# Patient Record
Sex: Male | Born: 1965 | Race: White | Hispanic: No | Marital: Single | Smoking: Former smoker
Health system: Southern US, Community
[De-identification: ages and names within clinical notes are randomized; demographics above are authoritative.]

## PROBLEM LIST (undated history)

## (undated) DIAGNOSIS — Z720 Tobacco use: Secondary | ICD-10-CM

## (undated) DIAGNOSIS — F1010 Alcohol abuse, uncomplicated: Secondary | ICD-10-CM

## (undated) DIAGNOSIS — I10 Essential (primary) hypertension: Secondary | ICD-10-CM

## (undated) DIAGNOSIS — E785 Hyperlipidemia, unspecified: Secondary | ICD-10-CM

## (undated) DIAGNOSIS — I471 Supraventricular tachycardia, unspecified: Secondary | ICD-10-CM

## (undated) DIAGNOSIS — F419 Anxiety disorder, unspecified: Secondary | ICD-10-CM

## (undated) HISTORY — PX: HAND SURGERY: SHX662

## (undated) HISTORY — DX: Tobacco use: Z72.0

## (undated) HISTORY — DX: Alcohol abuse, uncomplicated: F10.10

## (undated) HISTORY — DX: Supraventricular tachycardia: I47.1

## (undated) HISTORY — PX: TOE SURGERY: SHX1073

## (undated) HISTORY — DX: Supraventricular tachycardia, unspecified: I47.10

---

## 2011-07-07 ENCOUNTER — Inpatient Hospital Stay (INDEPENDENT_AMBULATORY_CARE_PROVIDER_SITE_OTHER)
Admission: RE | Admit: 2011-07-07 | Discharge: 2011-07-07 | Disposition: A | Discharge status: Home / Self Care | Payer: Managed Care, Other (non HMO) | Source: Ambulatory Visit | Attending: Emergency Medicine | Admitting: Emergency Medicine

## 2011-07-07 ENCOUNTER — Encounter: Payer: Self-pay | Admitting: Emergency Medicine

## 2011-07-07 DIAGNOSIS — I10 Essential (primary) hypertension: Secondary | ICD-10-CM | POA: Insufficient documentation

## 2011-07-07 DIAGNOSIS — IMO0002 Reserved for concepts with insufficient information to code with codable children: Secondary | ICD-10-CM

## 2011-07-10 ENCOUNTER — Telehealth (INDEPENDENT_AMBULATORY_CARE_PROVIDER_SITE_OTHER): Payer: Self-pay | Admitting: Emergency Medicine

## 2011-08-16 NOTE — Telephone Encounter (Signed)
  Phone Note Outgoing Call Call back at [facility] Phone [phone]   Call placed by: Lavell Islam RN,  July 10, 2011 3:15 PM Call placed to: Patient Summary of Call: Taking Rx for ABX; given wound culture results of +Staph; states area still oozing/red/edematous, but not spreading. Has appt. with PCP 10-29; told him we would be happy to evaluated here over weekend if he desires. Initial call taken by: Lavell Islam RN,  July 10, 2011 3:18 PM

## 2011-08-16 NOTE — Progress Notes (Signed)
Summary: BITE ON LT ARM...WSE   Vital Signs:  Patient Profile:   45 Years Old Male CC:      bite on LT arm x 3 days Height:     68 inches Weight:      202 pounds O2 Sat:      99 % O2 treatment:    Room Air Temp:     98.0 degrees F oral Pulse rate:   75 / minute Resp:     18 per minute BP sitting:   147 / 88  (left arm) Cuff size:   large  Vitals Entered By: Clemens Catholic LPN (July 07, 2011 6:14 PM)                  Updated Prior Medication List: BYSTOLIC 10 MG TABS (NEBIVOLOL HCL)  EXFORGE 10-160 MG TABS (AMLODIPINE BESYLATE-VALSARTAN)   Current Allergies: ! PRINIVILHistory of Present Illness Chief Complaint: bite on LT arm x 3 days History of Present Illness: ? spider bite on L arm around elbow.  He was working out in the yard and there were a lot of spiders around.  Doesn't recall any poison ivy and he has never gotten before.  No pain, no itching, no irritation.  It has been draining clear fluid.  Not using any meds.  REVIEW OF SYSTEMS Constitutional Symptoms      Denies fever, chills, night sweats, weight loss, weight gain, and fatigue.  Eyes       Denies change in vision, eye pain, eye discharge, glasses, contact lenses, and eye surgery. Ear/Nose/Throat/Mouth       Denies hearing loss/aids, change in hearing, ear pain, ear discharge, dizziness, frequent runny nose, frequent nose bleeds, sinus problems, sore throat, hoarseness, and tooth pain or bleeding.  Respiratory       Denies dry cough, productive cough, wheezing, shortness of breath, asthma, bronchitis, and emphysema/COPD.  Cardiovascular       Denies murmurs, chest pain, and tires easily with exhertion.    Gastrointestinal       Denies stomach pain, nausea/vomiting, diarrhea, constipation, blood in bowel movements, and indigestion. Genitourniary       Denies painful urination, kidney stones, and loss of urinary control. Neurological       Denies paralysis, seizures, and  fainting/blackouts. Musculoskeletal       Denies muscle pain, joint pain, joint stiffness, decreased range of motion, redness, swelling, muscle weakness, and gout.  Skin       Denies bruising, unusual mles/lumps or sores, and hair/skin or nail changes.  Psych       Denies mood changes, temper/anger issues, anxiety/stress, speech problems, depression, and sleep problems. Other Comments: pt c/o bite on LT arm x 3 days.no fever. no OTC meds.   Past History:  Past Medical History: Hypertension  Past Surgical History: Rt hand surgery x 4  Family History: Family History Hypertension gmother- AA  Social History: Never Smoked Alcohol use-yes 6 per day Drug use-no Smoking Status:  never Drug Use:  no Physical Exam General appearance: well developed, well nourished, no acute distress MSE: oriented to time, place, and person L arm: vesicular and macular rash anterior arm around elbow.  Clear fluid.  Mild erythema. Assessment New Problems: HYPERTENSION (ICD-401.9) CELLULITIS AND ABSCESS OF UPPER ARM AND FOREARM (ICD-682.3)  Contact derm (poison ivy) vs Mild cellulitis  Plan New Medications/Changes: TRIAMCINOLONE ACETONIDE 0.1 % CREA (TRIAMCINOLONE ACETONIDE) apply two times a day to affected area  (4x4cm)  #QS x 0, 07/07/2011, Hoyt Koch  MD DOXYCYCLINE HYCLATE 100 MG CAPS (DOXYCYCLINE HYCLATE) 1 by mouth two times a day for 1 week  #14 x 0, 07/07/2011, Hoyt Koch MD  New Orders: New Patient Level II [99202] T-Culture, Wound [[id number]] Planning Comments:   Warm compress, don't squeeze.  Take antibiotics as directed.  Also with steroid cream since I think poison ivy is more probable than cellulitis.  Follow-up with your primary care physician if not improving or if getting worse   The patient and/or caregiver has been counseled thoroughly with regard to medications prescribed including dosage, schedule, interactions, rationale for use, and possible side  effects and they verbalize understanding.  Diagnoses and expected course of recovery discussed and will return if not improved as expected or if the condition worsens. Patient and/or caregiver verbalized understanding.  Prescriptions: TRIAMCINOLONE ACETONIDE 0.1 % CREA (TRIAMCINOLONE ACETONIDE) apply two times a day to affected area  (4x4cm)  #QS x 0   Entered and Authorized by:   Hoyt Koch MD   Signed by:   Hoyt Koch MD on 07/07/2011   Method used:   Print then Give to Patient   RxID:   [id number] DOXYCYCLINE HYCLATE 100 MG CAPS (DOXYCYCLINE HYCLATE) 1 by mouth two times a day for 1 week  #14 x 0   Entered and Authorized by:   Hoyt Koch MD   Signed by:   Hoyt Koch MD on 07/07/2011   Method used:   Print then Give to Patient   RxID:   [id number]   Orders Added: 1)  New Patient Level II [99202] 2)  T-Culture, Wound [[id number]]

## 2013-11-07 ENCOUNTER — Encounter: Payer: Self-pay | Admitting: Emergency Medicine

## 2013-11-07 ENCOUNTER — Emergency Department (INDEPENDENT_AMBULATORY_CARE_PROVIDER_SITE_OTHER)
Admission: EM | Admit: 2013-11-07 | Discharge: 2013-11-07 | Disposition: A | Discharge status: Home / Self Care | Payer: PRIVATE HEALTH INSURANCE | Source: Home / Self Care | Attending: Family Medicine | Admitting: Family Medicine

## 2013-11-07 DIAGNOSIS — R197 Diarrhea, unspecified: Secondary | ICD-10-CM

## 2013-11-07 DIAGNOSIS — R110 Nausea: Secondary | ICD-10-CM

## 2013-11-07 HISTORY — DX: Anxiety disorder, unspecified: F41.9

## 2013-11-07 HISTORY — DX: Essential (primary) hypertension: I10

## 2013-11-07 HISTORY — DX: Hyperlipidemia, unspecified: E78.5

## 2013-11-07 MED ORDER — ONDANSETRON 4 MG PO TBDP
4.0000 mg | ORAL_TABLET | Freq: Once | ORAL | Status: AC
  Administered 2013-11-07: 4 mg via ORAL

## 2013-11-07 MED ORDER — ONDANSETRON 4 MG PO TBDP: ORAL_TABLET | ORAL | Status: DC

## 2013-11-07 NOTE — ED Provider Notes (Signed)
CSN: [id number]     Arrival date & time 11/07/13  [id number] History   First MD Initiated Contact with Patient 11/07/13 [id number]     Chief Complaint  Patient presents with  . Diarrhea       HPI Comments: Yesterday afternoon patient developed abdominal bloating after lunch, followed by persistent watery diarrhea.  He has had persistent nausea without vomiting.  He has had fatigue and chills/sweats.  Denies recent foreign travel, or drinking untreated water in a wilderness environment.   Patient is a 48 y.o. male presenting with diarrhea. The history is provided by the patient.  Diarrhea Quality:  Watery Severity:  Moderate Onset quality:  Sudden Duration:  1 day Timing:  Intermittent Progression:  Unchanged Relieved by:  Nothing Worsened by:  Nothing tried Ineffective treatments:  None tried Associated symptoms: abdominal pain, chills and diaphoresis   Associated symptoms: no arthralgias, no recent cough, no fever, no headaches, no myalgias, no URI and no vomiting   Risk factors: no recent antibiotic use, no sick contacts, no suspicious food intake and no travel to endemic areas     Past Medical History  Diagnosis Date  . Hypertension   . Anxiety   . Hyperlipidemia    Past Surgical History  Procedure Laterality Date  . Rt great toe     Family History  Problem Relation Age of Onset  . Cancer Mother   . Cancer Father   . Heart disease Father    History  Substance Use Topics  . Smoking status: Never Smoker   . Smokeless tobacco: Not on file  . Alcohol Use: Yes    Review of Systems  Constitutional: Positive for chills and diaphoresis. Negative for fever.  Gastrointestinal: Positive for abdominal pain and diarrhea. Negative for vomiting.  Musculoskeletal: Negative for arthralgias and myalgias.  Neurological: Negative for headaches.  All other systems reviewed and are negative.      Allergies  Lisinopril and Paxil  Home Medications   Current Outpatient Rx  Name  Route   Sig  Dispense  Refill  . ALPRAZolam (XANAX) 0.5 MG tablet   Oral   Take 0.5 mg by mouth at bedtime as needed for anxiety.         Marland Kitchen. amLODipine-valsartan (EXFORGE) 10-160 MG per tablet   Oral   Take 1 tablet by mouth daily.         Marland Kitchen. aspirin 81 MG tablet   Oral   Take 81 mg by mouth daily.         . Fenofibric Acid 105 MG TABS   Oral   Take by mouth.         . nebivolol (BYSTOLIC) 10 MG tablet   Oral   Take 10 mg by mouth daily.         Marland Kitchen. omeprazole (PRILOSEC) 10 MG capsule   Oral   Take 10 mg by mouth daily.         . ondansetron (ZOFRAN ODT) 4 MG disintegrating tablet      Take one tab by mouth Q6hr prn nausea   12 tablet   0    BP 131/83  Pulse 82  Temp(Src) 97.6 F (36.4 C) (Oral)  Ht 5\' 8"  (1.727 m)  Wt 209 lb (94.802 kg)  BMI 31.79 kg/m2  SpO2 97% Physical Exam Nursing notes and Vital Signs reviewed. Appearance:  Patient appears healthy, stated age, and in no acute distress Eyes:  Pupils are equal, round, and reactive to light and  accomodation.  Extraocular movement is intact.  Conjunctivae are not inflamed  Pharynx:  Normal; moist mucous membranes  Neck:  Supple.  No adenopathy Lungs:  Clear to auscultation.  Breath sounds are equal.  Heart:  Regular rate and rhythm without murmurs, rubs, or gallops.  Abdomen:  Vague peri-umbilical tenderness without masses or hepatosplenomegaly.  Bowel sounds are present and increased. No CVA or flank tenderness.  Extremities:  No edema.  No calf tenderness Skin:  No rash present.   ED Course  Procedures  none       MDM   Final diagnoses:  Nausea alone; suspect viral gastroenteritis  Diarrhea   Zofran ODT 4mg  administered.  Rx for Zofran 4mg  ODT  Begin clear liquids (Pedialyte while having diarrhea) until improved, then advance to a [employer] (Bananas, Rice, Applesauce, Toast).  Then gradually resume a regular diet when tolerated.  Avoid milk products until well.  To decrease diarrhea, mix one  heaping tablespoon Citrucel (methylcellulose) in 8 oz water and drink one to three times daily.  When stools become more formed, may take Imodium (loperamide) once or twice daily to decrease stool frequency.  If symptoms become significantly worse during the night or over the weekend, proceed to the local emergency room. Followup with Family Doctor if not improved in about 5 day    Lattie Haw, MD 11/07/13 [phone]

## 2013-11-07 NOTE — ED Notes (Signed)
Diarrhea since yesterday, abdominal pain, nausea

## 2013-11-07 NOTE — Discharge Instructions (Signed)
Begin clear liquids (Pedialyte while having diarrhea) until improved, then advance to a BRAT diet (Bananas, Rice, Applesauce, Toast).  Then gradually resume a regular diet when tolerated.  Avoid milk products until well.  To decrease diarrhea, mix one heaping tablespoon Citrucel (methylcellulose) in 8 oz water and drink one to three times daily.  When stools become more formed, may take Imodium (loperamide) once or twice daily to decrease stool frequency.  °If symptoms become significantly worse during the night or over the weekend, proceed to the local emergency room.  °

## 2013-11-11 ENCOUNTER — Telehealth: Payer: Self-pay

## 2013-11-11 NOTE — ED Notes (Signed)
I called and spoke with patient and he is doing better. I advised to call back if anything changes or if he has questions or concerns.  

## 2014-02-07 ENCOUNTER — Ambulatory Visit (INDEPENDENT_AMBULATORY_CARE_PROVIDER_SITE_OTHER): Payer: PRIVATE HEALTH INSURANCE | Admitting: Cardiovascular Disease

## 2014-02-07 ENCOUNTER — Encounter: Payer: Self-pay | Admitting: Cardiovascular Disease

## 2014-02-07 VITALS — BP 150/80 | HR 71 | Ht 68.0 in | Wt 213.1 lb

## 2014-02-07 DIAGNOSIS — I471 Supraventricular tachycardia, unspecified: Secondary | ICD-10-CM

## 2014-02-07 DIAGNOSIS — E785 Hyperlipidemia, unspecified: Secondary | ICD-10-CM

## 2014-02-07 DIAGNOSIS — F1010 Alcohol abuse, uncomplicated: Secondary | ICD-10-CM

## 2014-02-07 DIAGNOSIS — F17200 Nicotine dependence, unspecified, uncomplicated: Secondary | ICD-10-CM

## 2014-02-07 DIAGNOSIS — I10 Essential (primary) hypertension: Secondary | ICD-10-CM

## 2014-02-07 DIAGNOSIS — Z720 Tobacco use: Secondary | ICD-10-CM

## 2014-02-07 DIAGNOSIS — R002 Palpitations: Secondary | ICD-10-CM

## 2014-02-07 NOTE — Patient Instructions (Signed)
Dr Berry has recommended that you follow-up with him as needed. 

## 2014-02-07 NOTE — Assessment & Plan Note (Signed)
Patient was referred by Dr. Francoise Schaumann to be established in our practice for ongoing care of PSVT. He is a 48 year old mildly overweight Caucasian male with multiple cardiac risk factors. He has first episode of PSVT back in 2002. He has had stress tests, echocardiogram and a cardiac cath back in 2006 that was normal. He attributes this to stress. He is aware of the symptoms. He does get occasional palpitations on a daily basis. His last episode of PSVT was March 2012. He does drink 4-6 beers a day during weekdays and at the upper 12 pack a day on the weekend. He is resistant to risk factor modification. He also has a 20-pack-year history of tobacco abuse having quit 14 years ago

## 2014-02-07 NOTE — Assessment & Plan Note (Signed)
On fenofibrate probably are related to alcohol intake

## 2014-02-07 NOTE — Progress Notes (Signed)
02/07/2014 Lonnie Castillo   07/20/1966  [id number]  Primary Physician Cala Bradford, MD Primary Cardiologist: Runell Gess MD [name]   HPI:  Mr. Lonnie Castillo is a 48 year old mildly mildly overweight divorced Caucasian male with no children who works as an Journalist, newspaper. He was referred by Dr. Laurann Montana right for cardiovascular evaluation and to be established in our practice because of prior cardiac disease. He has a history of tobacco abuse, 15 pack years, having quit 14 years ago. History of hypertension and hyperlipidemia. There is no family history of heart disease. He has never had a heart attack or stroke and denies chest pain or shortness of breath. There is a history of PSVT  Dating back to 2002. He has had recurrent episodes over the years most recently March 2012. He had echocardiograms, stress test and a cardiac catheterization in 2006 which was normal. He does get  daily palpitations.   Current Outpatient Prescriptions  Medication Sig Dispense Refill  . ALPRAZolam (XANAX) 0.5 MG tablet Take 0.5 mg by mouth at bedtime as needed for anxiety.      Marland Kitchen amLODipine-valsartan (EXFORGE) 10-160 MG per tablet Take 1 tablet by mouth daily.      Marland Kitchen aspirin 81 MG tablet Take 81 mg by mouth daily.      . B Complex-C (B-COMPLEX WITH VITAMIN C) tablet Take 1 tablet by mouth daily.      . Choline Fenofibrate (FENOFIBRIC ACID) 135 MG CPDR Take 1 tablet by mouth daily.      . Fenofibric Acid 105 MG TABS Take by mouth.      . Multiple Vitamin (MULTIVITAMIN) tablet Take 1 tablet by mouth daily.      . naproxen sodium (ANAPROX) 550 MG tablet Take 550 mg by mouth as needed.      . nebivolol (BYSTOLIC) 10 MG tablet Take 10 mg by mouth daily.      . Omega-3 Fatty Acids (FISH OIL PO) Take 2,400 mg by mouth daily.      Marland Kitchen omeprazole (PRILOSEC) 10 MG capsule Take 10 mg by mouth daily.      . ondansetron (ZOFRAN ODT) 4 MG disintegrating tablet Take one tab by mouth Q6hr prn nausea  12  tablet  0  . Thiamine HCl (VITAMIN B-1) 250 MG tablet Take 250 mg by mouth daily.      . vitamin B-12 (CYANOCOBALAMIN) 1000 MCG tablet Take 1,000 mcg by mouth daily.       No current facility-administered medications for this visit.    Allergies  Allergen Reactions  . Lisinopril   . Paxil [Paroxetine Hcl]     History   Social History  . Marital Status: Single    Spouse Name: N/A    Number of Children: N/A  . Years of Education: N/A   Occupational History  . Not on file.   Social History Main Topics  . Smoking status: Former Smoker -- 1.50 packs/day    Types: Cigarettes    Quit date: 02/08/2000  . Smokeless tobacco: Not on file  . Alcohol Use: Yes  . Drug Use: Not on file  . Sexual Activity: Not on file   Other Topics Concern  . Not on file   Social History Narrative  . No narrative on file     Review of Systems: General: negative for chills, fever, night sweats or weight changes.  Cardiovascular: negative for chest pain, dyspnea on exertion, edema, orthopnea, palpitations, paroxysmal nocturnal dyspnea or shortness of  breath Dermatological: negative for rash Respiratory: negative for cough or wheezing Urologic: negative for hematuria Abdominal: negative for nausea, vomiting, diarrhea, bright red blood per rectum, melena, or hematemesis Neurologic: negative for visual changes, syncope, or dizziness All other systems reviewed and are otherwise negative except as noted above.    Blood pressure 150/80, pulse 71, height 5\' 8"  (1.727 m), weight 213 lb 1.6 oz (96.662 kg).  General appearance: alert and no distress Neck: no adenopathy, no carotid bruit, no JVD, supple, symmetrical, trachea midline and thyroid not enlarged, symmetric, no tenderness/mass/nodules Lungs: clear to auscultation bilaterally Heart: regular rate and rhythm, S1, S2 normal, no murmur, click, rub or gallop Extremities: extremities normal, atraumatic, no cyanosis or edema  EKG normal sinus rhythm  at 71 with no ST or T wave changes  ASSESSMENT AND PLAN:   PSVT (paroxysmal supraventricular tachycardia) Patient was referred by Dr. Francoise Schaumannynthia Wright to be established in our practice for ongoing care of PSVT. He is a 48 year old mildly overweight Caucasian male with multiple cardiac risk factors. He has first episode of PSVT back in 2002. He has had stress tests, echocardiogram and a cardiac cath back in 2006 that was normal. He attributes this to stress. He is aware of the symptoms. He does get occasional palpitations on a daily basis. His last episode of PSVT was March 2012. He does drink 4-6 beers a day during weekdays and at the upper 12 pack a day on the weekend. He is resistant to risk factor modification. He also has a 20-pack-year history of tobacco abuse having quit 14 years ago  HYPERTENSION Controlled on current medications  Hyperlipidemia On fenofibrate probably are related to alcohol intake      Runell GessJonathan J. Berry MD [facility]trFACP,FACC,FAHA, [facility] 02/07/2014 1:55 PM

## 2014-02-07 NOTE — Assessment & Plan Note (Signed)
Controlled on current medications 

## 2014-10-29 ENCOUNTER — Ambulatory Visit (INDEPENDENT_AMBULATORY_CARE_PROVIDER_SITE_OTHER): Payer: PRIVATE HEALTH INSURANCE | Admitting: Neurology

## 2014-10-29 ENCOUNTER — Encounter: Payer: Self-pay | Admitting: Neurology

## 2014-10-29 VITALS — BP 130/78 | HR 76 | Ht 68.0 in | Wt 205.0 lb

## 2014-10-29 DIAGNOSIS — G44021 Chronic cluster headache, intractable: Secondary | ICD-10-CM

## 2014-10-29 DIAGNOSIS — Z7289 Other problems related to lifestyle: Secondary | ICD-10-CM

## 2014-10-29 DIAGNOSIS — Z789 Other specified health status: Secondary | ICD-10-CM

## 2014-10-29 DIAGNOSIS — F1099 Alcohol use, unspecified with unspecified alcohol-induced disorder: Secondary | ICD-10-CM

## 2014-10-29 MED ORDER — ZOLMITRIPTAN 5 MG NA SOLN: 1.0000 | NASAL | Status: DC | PRN

## 2014-10-29 MED ORDER — VERAPAMIL HCL 80 MG PO TABS: 80.0000 mg | ORAL_TABLET | Freq: Three times a day (TID) | ORAL | Status: DC

## 2014-10-29 MED ORDER — ZOLMITRIPTAN 2.5 MG NA SOLN: 1.0000 | Freq: Every day | NASAL | Status: DC

## 2014-10-29 NOTE — Patient Instructions (Addendum)
1. Start taking verapamil 80mg  three times daily as preventative therapy 2. For severe headache, take zomig 2.5mg  nasal spray at headache onset.  I have given you a sample of the 2.5 and 5mg  nasal spray, if this helps, call my office so I can send a prescription 3. Call with update in 1 month 4. Return to clinic in 2 months

## 2014-10-29 NOTE — Addendum Note (Signed)
Addended by: Vivien RotaRIVER, Imre Vecchione H on: 1/61/09602/16/2016 02:30 PM   Modules accepted: Orders

## 2014-10-29 NOTE — Progress Notes (Signed)
Note faxed.

## 2014-10-29 NOTE — Progress Notes (Signed)
[facility] Clinic Note - Initial Visit   Date: 10/29/2014   Lonnie Castillo MRN: [id number] DOB: [birth year]   Dear Dr. Cliffton Asters:  Thank you for your kind referral of Lonnie Castillo for consultation of headaches. Although his history is well known to you, please allow [place] to reiterate it for the purpose of our medical record. The patient was accompanied to the clinic by self.    History of Present Illness: Lonnie Castillo is a 49 y.o. right-handed Caucasian male with hypertension, GERD, prior tobacco use, vitamin B12 deficiency presenting for evaluation of headaches.    He reports having headaches since 2012 which occurs only on the left side with stabbing pain around the eye and cheek.  Headaches occur daily over the past 6 weeks, but he has episodic severe debilitating pain occuring several times a day, which lasts from seconds to 2 min.  He reports having mild droopiness of the right eye lid, redness of the eye, and tearing of the left eye when pain is severe.  He denies nausea/vomiting, photophobia, or phonophobia. Staying busy and working helps distract him from the pain.  He has previously tried Aleve which helped for a while and topamax (does not recall why he stopped it). He also complains of having difficulty recognizing objects.  He admits to have short-term memory problems.  Denies problems with driving or getting lost.    He has been drinking beer since the age of 9 and was drinking heavy while in the [employer].  He has reduced his beer intake from 8 beers nightly to 2-3 per day.   He has MRI brain in 2013 which was normal per report.   Past Medical History  Diagnosis Date  . Hypertension   . Anxiety   . Hyperlipidemia   . PSVT (paroxysmal supraventricular tachycardia)   . Tobacco abuse   . Alcohol abuse     Past Surgical History  Procedure Laterality Date  . Toe surgery Right   . Hand surgery Right      Medications:  Current Outpatient  Prescriptions on File Prior to Visit  Medication Sig Dispense Refill  . ALPRAZolam (XANAX) 0.5 MG tablet Take 0.5 mg by mouth at bedtime as needed for anxiety.    Marland Kitchen amLODipine-valsartan (EXFORGE) 10-160 MG per tablet Take 1 tablet by mouth daily.    Marland Kitchen aspirin 81 MG tablet Take 81 mg by mouth daily.    . B Complex-C (B-COMPLEX WITH VITAMIN C) tablet Take 1 tablet by mouth daily.    . Fenofibric Acid 105 MG TABS Take by mouth.    . Multiple Vitamin (MULTIVITAMIN) tablet Take 1 tablet by mouth daily.    . naproxen sodium (ANAPROX) 550 MG tablet Take 550 mg by mouth as needed.    . nebivolol (BYSTOLIC) 10 MG tablet Take 10 mg by mouth daily.    . Omega-3 Fatty Acids (FISH OIL PO) Take 2,400 mg by mouth daily.    Marland Kitchen omeprazole (PRILOSEC) 10 MG capsule Take 10 mg by mouth daily.    . Thiamine HCl (VITAMIN B-1) 250 MG tablet Take 250 mg by mouth daily.    . vitamin B-12 (CYANOCOBALAMIN) 1000 MCG tablet Take 1,000 mcg by mouth daily.     No current facility-administered medications on file prior to visit.    Allergies:  Allergies  Allergen Reactions  . Lisinopril   . Paxil [Paroxetine Hcl]     Family History: Family History  Problem Relation Age of Onset  .  Hyperlipidemia Mother   . Acromegaly Mother   . Arthritis Mother     Osteoarthritis  . Pulmonary embolism Father   . Healthy Brother     Social History: History   Social History  . Marital Status: Single    Spouse Name: N/A  . Number of Children: N/A  . Years of Education: N/A   Occupational History  . Not on file.   Social History Main Topics  . Smoking status: Former Smoker -- 1.50 packs/day for 15 years    Types: Cigarettes    Quit date: 02/08/2000  . Smokeless tobacco: Not on file  . Alcohol Use: 0.0 oz/week    0 Standard drinks or equivalent per week     Comment: 18 back of beer a week  . Drug Use: No  . Sexual Activity: Not on file   Other Topics Concern  . Not on file   Social History Narrative   He  lives alone.   He works as an Engineer, maintenanceauto technician.   Highest level of education:  did not complete associated degree    Review of Systems:  CONSTITUTIONAL: No fevers, chills, night sweats, or weight loss.   EYES: No visual changes or eye pain ENT: No hearing changes.  No history of nose bleeds.   RESPIRATORY: No cough, wheezing and shortness of breath.   CARDIOVASCULAR: Negative for chest pain, and palpitations.   GI: Negative for abdominal discomfort, blood in stools or black stools.  No recent change in bowel habits.   GU:  No history of incontinence.   MUSCLOSKELETAL: No history of joint pain or swelling.  No myalgias.   SKIN: Negative for lesions, rash, and itching.   HEMATOLOGY/ONCOLOGY: Negative for prolonged bleeding, bruising easily, and swollen nodes.  No history of cancer.   ENDOCRINE: Negative for cold or heat intolerance, polydipsia or goiter.   PSYCH:  No depression or anxiety symptoms.   NEURO: As Above.   Vital Signs:  BP 130/78 mmHg  Pulse 76  Ht 5\' 8"  (1.727 m)  Wt 205 lb (92.987 kg)  BMI 31.18 kg/m2   General Medical Exam:   General:  Well appearing, comfortable.   Eyes/ENT: see cranial nerve examination.   Neck: No masses appreciated.  Full range of motion without tenderness.  No carotid bruits. Respiratory:  Clear to auscultation, good air entry bilaterally.   Cardiac:  Regular rate and rhythm, no murmur.   Extremities:  No deformities, edema, or skin discoloration.  Skin:  No rashes or lesions.  Neurological Exam: MENTAL STATUS including orientation to time, place, person, recent and remote memory, attention span and concentration, language, and fund of knowledge is normal.  Speech is not dysarthric.  CRANIAL NERVES: II:  No visual field defects.  Unremarkable fundi.   III-IV-VI: Pupils equal round and reactive to light.  Normal conjugate, extra-ocular eye movements in all directions of gaze.  No nystagmus.  No ptosis.   V:  Normal facial sensation.       VII:  Normal facial symmetry and movements.  No pathologic facial reflexes.  VIII:  Normal hearing and vestibular function.   IX-X:  Normal palatal movement.   XI:  Normal shoulder shrug and head rotation.   XII:  Normal tongue strength and range of motion, no deviation or fasciculation.  MOTOR:  No atrophy, fasciculations or abnormal movements.  No pronator drift.  Tone is normal.    Right Upper Extremity:    Left Upper Extremity:    Deltoid  5/5   Deltoid  5/5   Biceps  5/5   Biceps  5/5   Triceps  5/5   Triceps  5/5   Wrist extensors  5/5   Wrist extensors  5/5   Wrist flexors  5/5   Wrist flexors  5/5   Finger extensors  5/5   Finger extensors  5/5   Finger flexors  5/5   Finger flexors  5/5   Dorsal interossei  5/5   Dorsal interossei  5/5   Abductor pollicis  5/5   Abductor pollicis  5/5   Tone (Ashworth scale)  0  Tone (Ashworth scale)  0   Right Lower Extremity:    Left Lower Extremity:    Hip flexors  5/5   Hip flexors  5/5   Hip extensors  5/5   Hip extensors  5/5   Knee flexors  5/5   Knee flexors  5/5   Knee extensors  5/5   Knee extensors  5/5   Dorsiflexors  5/5   Dorsiflexors  5/5   Plantarflexors  5/5   Plantarflexors  5/5   Toe extensors  5/5   Toe extensors  5/5   Toe flexors  5/5   Toe flexors  5/5   Tone (Ashworth scale)  0  Tone (Ashworth scale)  0   MSRs:  Right                                                                 Left brachioradialis 2+  brachioradialis 2+  biceps 2+  biceps 2+  triceps 2+  triceps 2+  patellar 2+  patellar 2+  ankle jerk 2+  ankle jerk 1+  Hoffman no  Hoffman no  plantar response down  plantar response down   SENSORY:  Vibration diminished at the left great toe, otherwise normal and symmetric perception of light touch, pinprick, and proprioception.  Romberg's sign absent.   COORDINATION/GAIT: Normal finger-to- nose-finger and heel-to-shin.  Intact rapid alternating movements bilaterally.  Able to rise from a chair  without using arms.  Gait narrow based and stable. Tandem and stressed gait intact.    IMPRESSION: Mr. Potts is a 49 year-old gentleman with alcohol dependence presenting for evaluation of chronic left periorbital headaches.  He has associated autonomic dysfunction associated with it.  Based on the frequency and duration of his headaches, he most likely has cluster headaches.  I will start him on verapamil /d for preventative therapy and offer zomig nasal spray for abortive therapy.  I further encouraged him on alcohol cessation from generalized health standpoint, but further to prevent long-term neurological complications including cognitive impairment and neuropathy.   PLAN/RECOMMENDATIONS:  1.  Start verapamil  TID for preventative therapy.  Instructed to monitor blood pressure while taking this since he is already on several BP medications 2.  Samples provided for zomig 2.5mg /5mg  for abortive therapy.  Consider oxygen therapy going forward 3.  Patient to call with update in 1 month 4.  Return to clinic in 2 months   The duration of this appointment visit was 50 minutes of face-to-face time with the patient.  Greater than 50% of this time was spent in counseling, explanation of diagnosis, planning of further management, and coordination of care.  Thank you for allowing me to participate in patient's care.  If I can answer any additional questions, I would be pleased to do so.    Sincerely,    Husam Hohn K. Posey Pronto, DO

## 2015-01-14 ENCOUNTER — Encounter: Payer: Self-pay | Admitting: Neurology

## 2015-01-14 ENCOUNTER — Ambulatory Visit (INDEPENDENT_AMBULATORY_CARE_PROVIDER_SITE_OTHER): Payer: PRIVATE HEALTH INSURANCE | Admitting: Neurology

## 2015-01-14 VITALS — BP 130/80 | HR 72 | Ht 68.0 in | Wt 200.0 lb

## 2015-01-14 DIAGNOSIS — G44029 Chronic cluster headache, not intractable: Secondary | ICD-10-CM | POA: Insufficient documentation

## 2015-01-14 DIAGNOSIS — Z79899 Other long term (current) drug therapy: Secondary | ICD-10-CM

## 2015-01-14 MED ORDER — NORTRIPTYLINE HCL 10 MG PO CAPS: 10.0000 mg | ORAL_CAPSULE | Freq: Every day | ORAL | Status: DC

## 2015-01-14 MED ORDER — VERAPAMIL HCL 80 MG PO TABS: 80.0000 mg | ORAL_TABLET | Freq: Three times a day (TID) | ORAL | Status: DC

## 2015-01-14 MED ORDER — FLURBIPROFEN 50 MG PO TABS: 50.0000 mg | ORAL_TABLET | Freq: Two times a day (BID) | ORAL | Status: AC | PRN

## 2015-01-14 NOTE — Progress Notes (Signed)
Follow-up Visit   Date: 01/14/2015    Lonnie PebblesKevin Phung MRN: [id number] DOB: 07/31/1966   Interim History: Lonnie Castillo is a 49 y.o. right-handed Caucasian male with hypertension, GERD, prior tobacco use, vitamin B12 deficiency returning to the clinic for follow-up of cluster headaches.  The patient was accompanied to the clinic by self.  History of present illness: He reports having headaches since 2012 which occurs only on the left side with stabbing pain around the eye and cheek. Headaches occur daily over the past 6 weeks, but he has episodic severe debilitating pain occuring several times a day, which lasts from seconds to 2 min. He reports having mild droopiness of the right eye lid, redness of the eye, and tearing of the left eye when pain is severe. He denies nausea/vomiting, photophobia, or phonophobia. Staying busy and working helps distract him from the pain. He has previously tried Aleve which helped for a while and topamax (does not recall why he stopped it). He also complains of having difficulty recognizing objects. He admits to have short-term memory problems. Denies problems with driving or getting lost.   He has been drinking beer since the age of 49 and was drinking heavy while in the [employer]. He has reduced his beer intake from 8 beers nightly to 2-3 per day.   He has MRI brain in 2013 which was normal per report.  UPDATE 01/14/2015:  He has noticed improved frequency, duration, and intensity, but continues to have headaches every other day. Headaches last 1-2 hours and pain is ranked as 5-6/10.   He has not missed any days from work due to pain.  Unfortunately, there was no improvement with Zomig.    :  Medications:  Current Outpatient Prescriptions on File Prior to Visit  Medication Sig Dispense Refill  . ALPRAZolam (XANAX) 0.5 MG tablet Take 0.5 mg by mouth at bedtime as needed for anxiety.    Marland Kitchen. amLODipine-valsartan (EXFORGE) 10-160 MG per tablet Take 1 tablet  by mouth daily.    Marland Kitchen. aspirin 81 MG tablet Take 81 mg by mouth daily.    . B Complex-C (B-COMPLEX WITH VITAMIN C) tablet Take 1 tablet by mouth daily.    . cholecalciferol (VITAMIN D) 1000 UNITS tablet Take 1,000 Units by mouth daily.    . Fenofibric Acid 105 MG TABS Take by mouth.    . Multiple Vitamin (MULTIVITAMIN) tablet Take 1 tablet by mouth daily.    . naproxen sodium (ANAPROX) 550 MG tablet Take 550 mg by mouth as needed.    . nebivolol (BYSTOLIC) 10 MG tablet Take 10 mg by mouth daily.    . Omega-3 Fatty Acids (FISH OIL PO) Take 2,400 mg by mouth daily.    Marland Kitchen. omeprazole (PRILOSEC) 10 MG capsule Take 10 mg by mouth daily.    . Thiamine HCl (VITAMIN B-1) 250 MG tablet Take 250 mg by mouth daily.    . vitamin B-12 (CYANOCOBALAMIN) 1000 MCG tablet Take 1,000 mcg by mouth daily.    Marland Kitchen. ZOLMitriptan (ZOMIG) 2.5 MG SOLN Place 1 spray into the nose daily. 1 each 0   No current facility-administered medications on file prior to visit.    Allergies:  Allergies  Allergen Reactions  . Lisinopril   . Paxil [Paroxetine Hcl]     Review of Systems:  CONSTITUTIONAL: No fevers, chills, night sweats, or weight loss.  EYES: No visual changes or eye pain ENT: No hearing changes.  No history of nose bleeds.   RESPIRATORY:  No cough, wheezing and shortness of breath.   CARDIOVASCULAR: Negative for chest pain, and palpitations.   GI: Negative for abdominal discomfort, blood in stools or black stools.  No recent change in bowel habits.   GU:  No history of incontinence.   MUSCLOSKELETAL: +history of joint pain or swelling.  No myalgias.   SKIN: Negative for lesions, rash, and itching.   ENDOCRINE: Negative for cold or heat intolerance, polydipsia or goiter.   PSYCH:  No depression or anxiety symptoms.   NEURO: As Above.   Vital Signs:  BP 130/80 mmHg  Pulse 72  Ht  (1.727 m)  Wt 200 lb (90.719 kg)  BMI 30.42 kg/m2  SpO2 98%  Neurological Exam: MENTAL STATUS including orientation to  time, place, person, recent and remote memory, attention span and concentration, language, and fund of knowledge is normal.  Speech is not dysarthric.  CRANIAL NERVES:   Face is symmetric.   MOTOR:  Motor strength is 5/5 in all extremities.  No pronator drift.  Tone is normal.    COORDINATION/GAIT:  Normal finger-to- nose-finger and heel-to-shin.  Antalgic gait due to right hip pain   IMPRESSION/PLAN: Chronic cluster headaches  - Slightly improved with verapamil  TID, discussed up titritating but he is reluctant with already taking anti-hypertensive medications  - Start nortriptyline  at bedtime for one month, then increase by  qhs each months, goal of  qhs  - Continue verapamil  three times daily  - For severe migraine, ok to use Ansaid  twice daily as needed  - Call with an update in 1 month and to see if we have samples of Zecuity (transdermal patch)  Return to clinic in 3 months  The duration of this appointment visit was 25 minutes of face-to-face time with the patient.  Greater than 50% of this time was spent in counseling, explanation of diagnosis, planning of further management, and coordination of care.   Thank you for allowing me to participate in patient's care.  If I can answer any additional questions, I would be pleased to do so.    Sincerely,    Tysheena Ginzburg K. Allena Katz, DO

## 2015-01-14 NOTE — Patient Instructions (Addendum)
1.  Start nortriptyline 10mg  at bedtime for one month, then increase 2 tablets at bedtime x 1 month, then increase to 3 tablets at bedtime and continue. 2.  Continue verapamil 80mg  three times daily 3.  For severe migraine, ok to use Ansaid 50mg  twice daily as needed 4.  Call with an update in 1 month and to see if we have samples of Zecuity (transdermal patch) 5.  Return to clinic in 3 months

## 2015-02-19 ENCOUNTER — Emergency Department (INDEPENDENT_AMBULATORY_CARE_PROVIDER_SITE_OTHER): Payer: PRIVATE HEALTH INSURANCE

## 2015-02-19 ENCOUNTER — Emergency Department (INDEPENDENT_AMBULATORY_CARE_PROVIDER_SITE_OTHER)
Admission: EM | Admit: 2015-02-19 | Discharge: 2015-02-19 | Disposition: A | Discharge status: Home / Self Care | Payer: PRIVATE HEALTH INSURANCE | Source: Home / Self Care | Attending: Family Medicine | Admitting: Family Medicine

## 2015-02-19 ENCOUNTER — Encounter: Payer: Self-pay | Admitting: *Deleted

## 2015-02-19 DIAGNOSIS — M546 Pain in thoracic spine: Secondary | ICD-10-CM | POA: Diagnosis not present

## 2015-02-19 MED ORDER — CYCLOBENZAPRINE HCL 10 MG PO TABS: ORAL_TABLET | ORAL | Status: DC

## 2015-02-19 MED ORDER — PREDNISONE 20 MG PO TABS: 20.0000 mg | ORAL_TABLET | Freq: Two times a day (BID) | ORAL | Status: DC

## 2015-02-19 NOTE — ED Notes (Signed)
Pt c/o mid back pain x 2 1/2 weeks after pulling a muscle, increased pain x last night. No OTC meds.
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
## 2015-02-19 NOTE — Discharge Instructions (Signed)
Apply ice pack for 20 to 30 minutes, 3 to 4 times daily  Continue until pain decreases.  °

## 2015-02-20 ENCOUNTER — Ambulatory Visit
Admission: RE | Admit: 2015-02-20 | Discharge: 2015-02-20 | Disposition: A | Discharge status: Home / Self Care | Payer: PRIVATE HEALTH INSURANCE | Source: Ambulatory Visit | Attending: Family Medicine | Admitting: Family Medicine

## 2015-02-20 ENCOUNTER — Other Ambulatory Visit: Payer: Self-pay | Admitting: Family Medicine

## 2015-02-20 DIAGNOSIS — M25551 Pain in right hip: Secondary | ICD-10-CM

## 2015-04-10 ENCOUNTER — Other Ambulatory Visit: Payer: Self-pay | Admitting: *Deleted

## 2015-04-10 ENCOUNTER — Telehealth: Payer: Self-pay | Admitting: Neurology

## 2015-04-10 MED ORDER — NORTRIPTYLINE HCL 10 MG PO CAPS: 20.0000 mg | ORAL_CAPSULE | Freq: Every day | ORAL | Status: DC

## 2015-04-10 NOTE — Telephone Encounter (Signed)
I called patient back to see which dose he is on since we were titrating up to 50 mg qhs.  He said that he stopped at 20 mg qhs since it is working.  Rx sent in.

## 2015-04-10 NOTE — Telephone Encounter (Signed)
Noted  

## 2015-04-10 NOTE — Telephone Encounter (Signed)
Pt called and said he needs a refill prescription of his Nortriptyline called in, he has run out and stated he needs double the dosage because it's what is working for him/Dawn CB# [phone]

## 2015-05-02 ENCOUNTER — Ambulatory Visit: Payer: PRIVATE HEALTH INSURANCE | Admitting: Neurology

## 2015-05-22 ENCOUNTER — Ambulatory Visit: Payer: PRIVATE HEALTH INSURANCE | Admitting: Neurology

## 2015-05-26 ENCOUNTER — Encounter: Payer: Self-pay | Admitting: Neurology

## 2015-05-26 ENCOUNTER — Ambulatory Visit (INDEPENDENT_AMBULATORY_CARE_PROVIDER_SITE_OTHER): Payer: PRIVATE HEALTH INSURANCE | Admitting: Neurology

## 2015-05-26 ENCOUNTER — Telehealth: Payer: Self-pay | Admitting: *Deleted

## 2015-05-26 VITALS — BP 130/80 | HR 80 | Ht 68.0 in | Wt 196.5 lb

## 2015-05-26 DIAGNOSIS — G44029 Chronic cluster headache, not intractable: Secondary | ICD-10-CM | POA: Diagnosis not present

## 2015-05-26 MED ORDER — NORTRIPTYLINE HCL 25 MG PO CAPS: 25.0000 mg | ORAL_CAPSULE | Freq: Every day | ORAL | Status: AC

## 2015-05-26 MED ORDER — VERAPAMIL HCL 80 MG PO TABS: 80.0000 mg | ORAL_TABLET | Freq: Two times a day (BID) | ORAL | Status: DC

## 2015-05-26 NOTE — Telephone Encounter (Deleted)
patien returned your call his is still taking his P

## 2015-05-26 NOTE — Progress Notes (Signed)
Follow-up Visit   Date: 05/26/2015    Lonnie Castillo MRN: [id number] DOB: Aug 27, 1966   Interim History: Lonnie Castillo is a 49 y.o. right-handed Caucasian male with hypertension, GERD, prior tobacco use, vitamin B12 deficiency returning to the clinic for follow-up of cluster headaches.  The patient was accompanied to the clinic by self.  History of present illness: He reports having headaches since 2012 which occurs only on the left side with stabbing pain around the eye and cheek. Headaches occur daily over the past 6 weeks, but he has episodic severe debilitating pain occuring several times a day, which lasts from seconds to 2 min. He reports having mild droopiness of the right eye lid, redness of the eye, and tearing of the left eye when pain is severe. He denies nausea/vomiting, photophobia, or phonophobia. Staying busy and working helps distract him from the pain. He has previously tried Aleve which helped for a while and topamax (does not recall why he stopped it). He also complains of having difficulty recognizing objects. He admits to have short-term memory problems. Denies problems with driving or getting lost.   He has been drinking beer since the age of 6 and was drinking heavy while in the [employer]. He has reduced his beer intake from 8 beers nightly to 2-3 per day.   He has MRI brain in 2013 which was normal per report.  UPDATE 01/14/2015:  He has noticed improved frequency, duration, and intensity, but continues to have headaches every other day. Headaches last 1-2 hours and pain is ranked as 5-6/10.   He has not missed any days from work due to pain.  Unfortunately, there was no improvement with Zomig.    UPDATE 05/26/2015:  Since his last visit, he reports only have two headaches.  Pain is well controlled on verapamil 80mg  BID and nortriptyline 20mg  qhs.  He often skips his afternoon dose of the verapamil, so is now taking it twice daily.     Medications:  Current  Outpatient Prescriptions on File Prior to Visit  Medication Sig Dispense Refill  . ALPRAZolam (XANAX) 0.5 MG tablet Take 0.5 mg by mouth at bedtime as needed for anxiety.    Marland Kitchen amLODipine-valsartan (EXFORGE) 10-160 MG per tablet Take 1 tablet by mouth daily.    Marland Kitchen aspirin 81 MG tablet Take 81 mg by mouth daily.    . B Complex-C (B-COMPLEX WITH VITAMIN C) tablet Take 1 tablet by mouth daily.    . cholecalciferol (VITAMIN D) 1000 UNITS tablet Take 1,000 Units by mouth daily.    . cyclobenzaprine (FLEXERIL) 10 MG tablet Take one tab by mouth once or twice daily as needed for muscle spasm 20 tablet 0  . Fenofibric Acid 105 MG TABS Take by mouth.    . flurbiprofen (ANSAID) 50 MG tablet Take 1 tablet (50 mg total) by mouth 2 (two) times daily as needed (severe migraine). 20 tablet 3  . Multiple Vitamin (MULTIVITAMIN) tablet Take 1 tablet by mouth daily.    . naproxen sodium (ANAPROX) 550 MG tablet Take 550 mg by mouth as needed.    . nebivolol (BYSTOLIC) 10 MG tablet Take 10 mg by mouth daily.    . nortriptyline (PAMELOR) 10 MG capsule Take 1 capsule (10 mg total) by mouth at bedtime. 60 capsule 3  . Omega-3 Fatty Acids (FISH OIL PO) Take 2,400 mg by mouth daily.    Marland Kitchen omeprazole (PRILOSEC) 10 MG capsule Take 10 mg by mouth daily.    Marland Kitchen  predniSONE (DELTASONE) 20 MG tablet Take 1 tablet (20 mg total) by mouth 2 (two) times daily. Take with food. 10 tablet 0  . Thiamine HCl (VITAMIN B-1) 250 MG tablet Take 250 mg by mouth daily.    . verapamil (CALAN) 80 MG tablet Take 1 tablet (80 mg total) by mouth 3 (three) times daily. 90 tablet 3  . vitamin B-12 (CYANOCOBALAMIN) 1000 MCG tablet Take 1,000 mcg by mouth daily.    Marland Kitchen ZOLMitriptan (ZOMIG) 2.5 MG SOLN Place 1 spray into the nose daily. 1 each 0   No current facility-administered medications on file prior to visit.    Allergies:  Allergies  Allergen Reactions  . Lisinopril   . Paxil [Paroxetine Hcl]     Review of Systems:  CONSTITUTIONAL: No  fevers, chills, night sweats, or weight loss.  EYES: No visual changes or eye pain ENT: No hearing changes.  No history of nose bleeds.   RESPIRATORY: No cough, wheezing and shortness of breath.   CARDIOVASCULAR: Negative for chest pain, and palpitations.   GI: Negative for abdominal discomfort, blood in stools or black stools.  No recent change in bowel habits.   GU:  No history of incontinence.   MUSCLOSKELETAL: +history of joint pain or swelling.  No myalgias.   SKIN: Negative for lesions, rash, and itching.   ENDOCRINE: Negative for cold or heat intolerance, polydipsia or goiter.   PSYCH:  No depression or anxiety symptoms.   NEURO: As Above.   Vital Signs:  BP 130/80 mmHg  Pulse 80  Ht  (1.727 m)  Wt 196 lb 8 oz (89.132 kg)  BMI 29.88 kg/m2  SpO2 97%  Neurological Exam: MENTAL STATUS including orientation to time, place, person, recent and remote memory, attention span and concentration, language, and fund of knowledge is normal.  Speech is not dysarthric.  CRANIAL NERVES:   Face is symmetric.   MOTOR:  Motor strength is 5/5 in all extremities.  No pronator drift.  Tone is normal.    COORDINATION/GAIT:   Mildly antalgic gait due to right hip pain   IMPRESSION/PLAN: Chronic cluster headaches, significantly improved   - Start nortriptyline  at bedtime   - Continue verapamil  twice daily  - For severe migraine, ok to use Ansaid  twice daily as needed  Return to clinic in 1 year   The duration of this appointment visit was 25 minutes of face-to-face time with the patient.  Greater than 50% of this time was spent in counseling, explanation of diagnosis, planning of further management, and coordination of care.   Thank you for allowing me to participate in patient's care.  If I can answer any additional questions, I would be pleased to do so.    Sincerely,    Darrnell Mangiaracina K. Allena Katz, DO

## 2015-05-26 NOTE — Patient Instructions (Addendum)
1. Start nortriptyline  at bedtime  2. Continue verapamil  twice daily  Return to clinic in 1 year or sooner as needed

## 2015-05-26 NOTE — Telephone Encounter (Signed)
error 

## 2015-07-10 IMAGING — CR DG HIP (WITH OR WITHOUT PELVIS) 2-3V*R*
3 series · 3 of 3 positions shown · non-contrast
Comparison: None.

CLINICAL DATA: Right hip pain for 3 months. No known injury.
Evaluate for arthritis

EXAM:
RIGHT HIP (WITH PELVIS) 2-3 VIEWS

[w pelvis (1 of 2)]
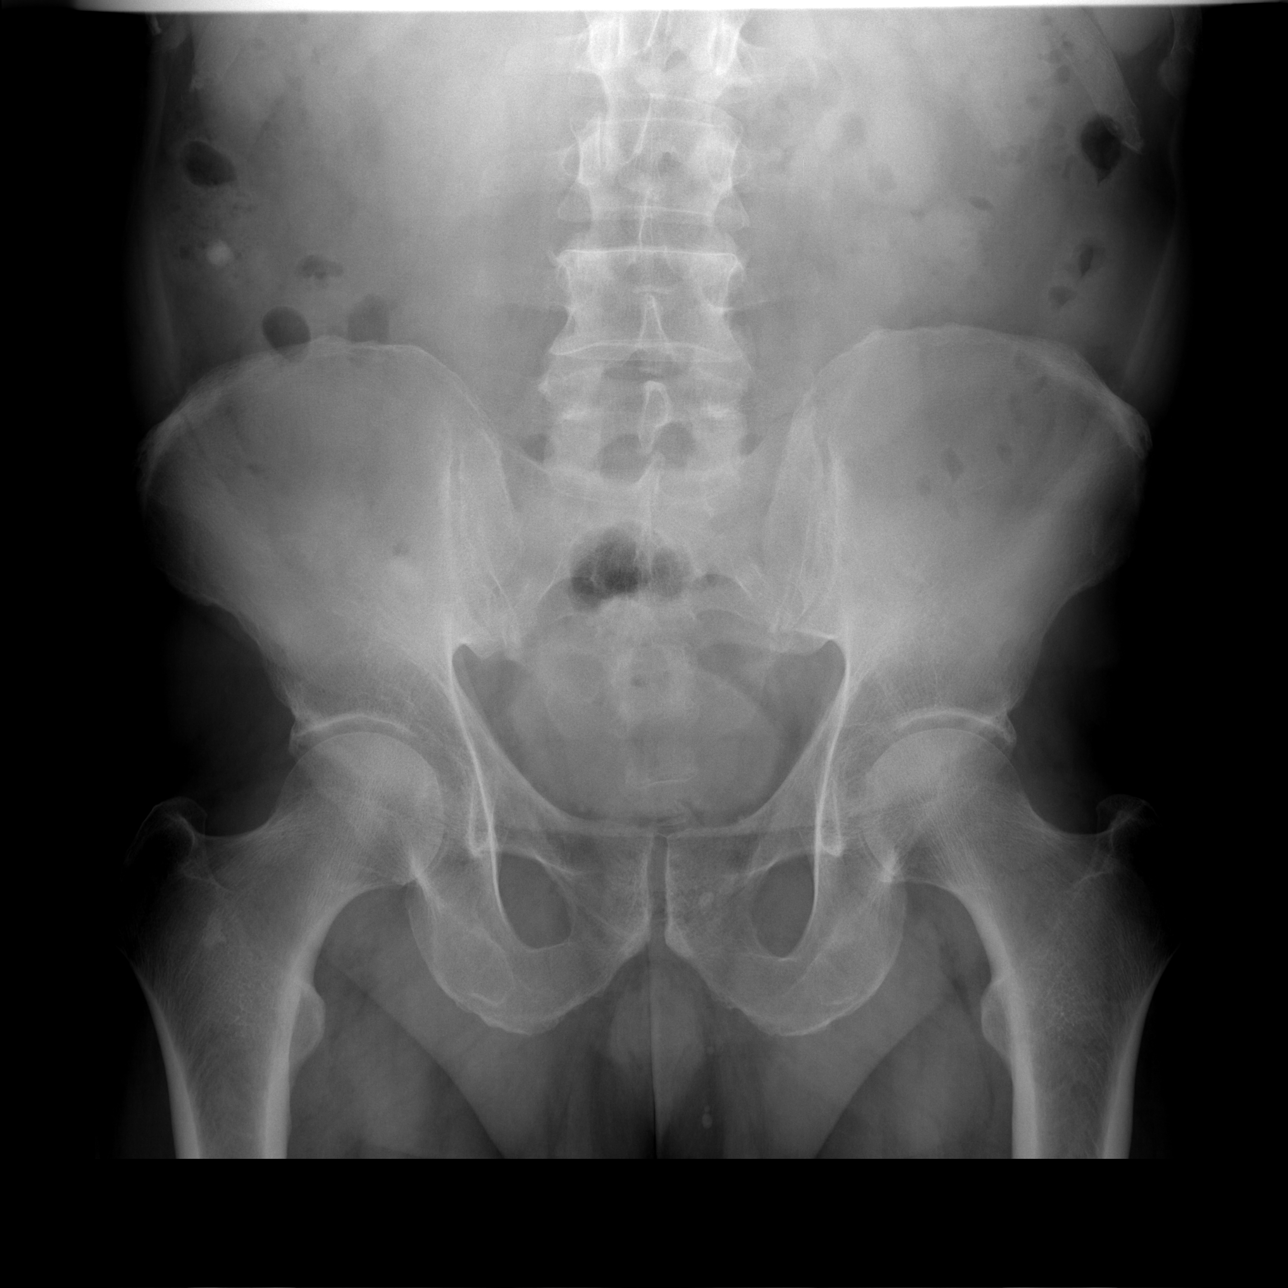

[w pelvis (2 of 2)]
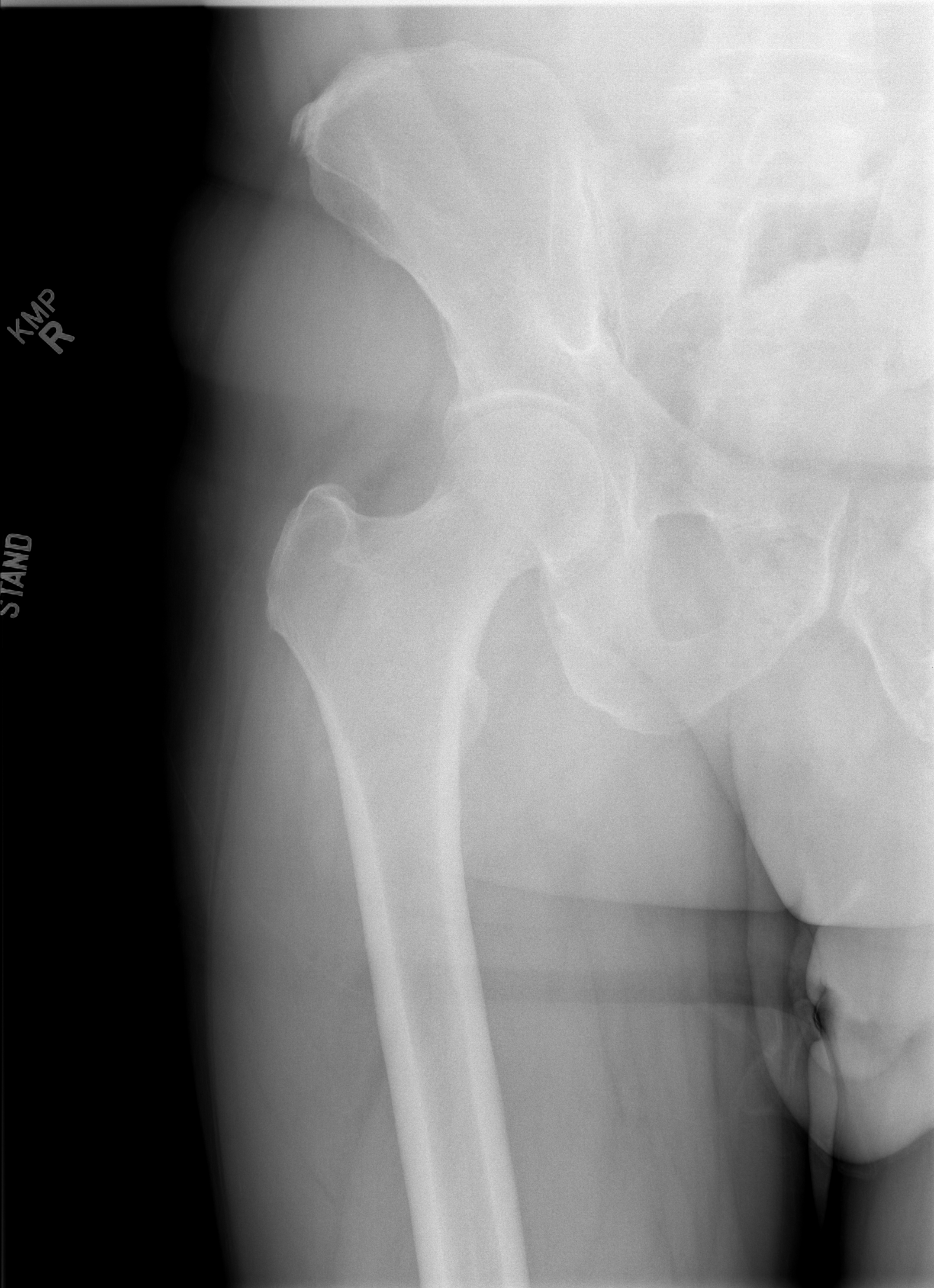

[t hip frog leg right]
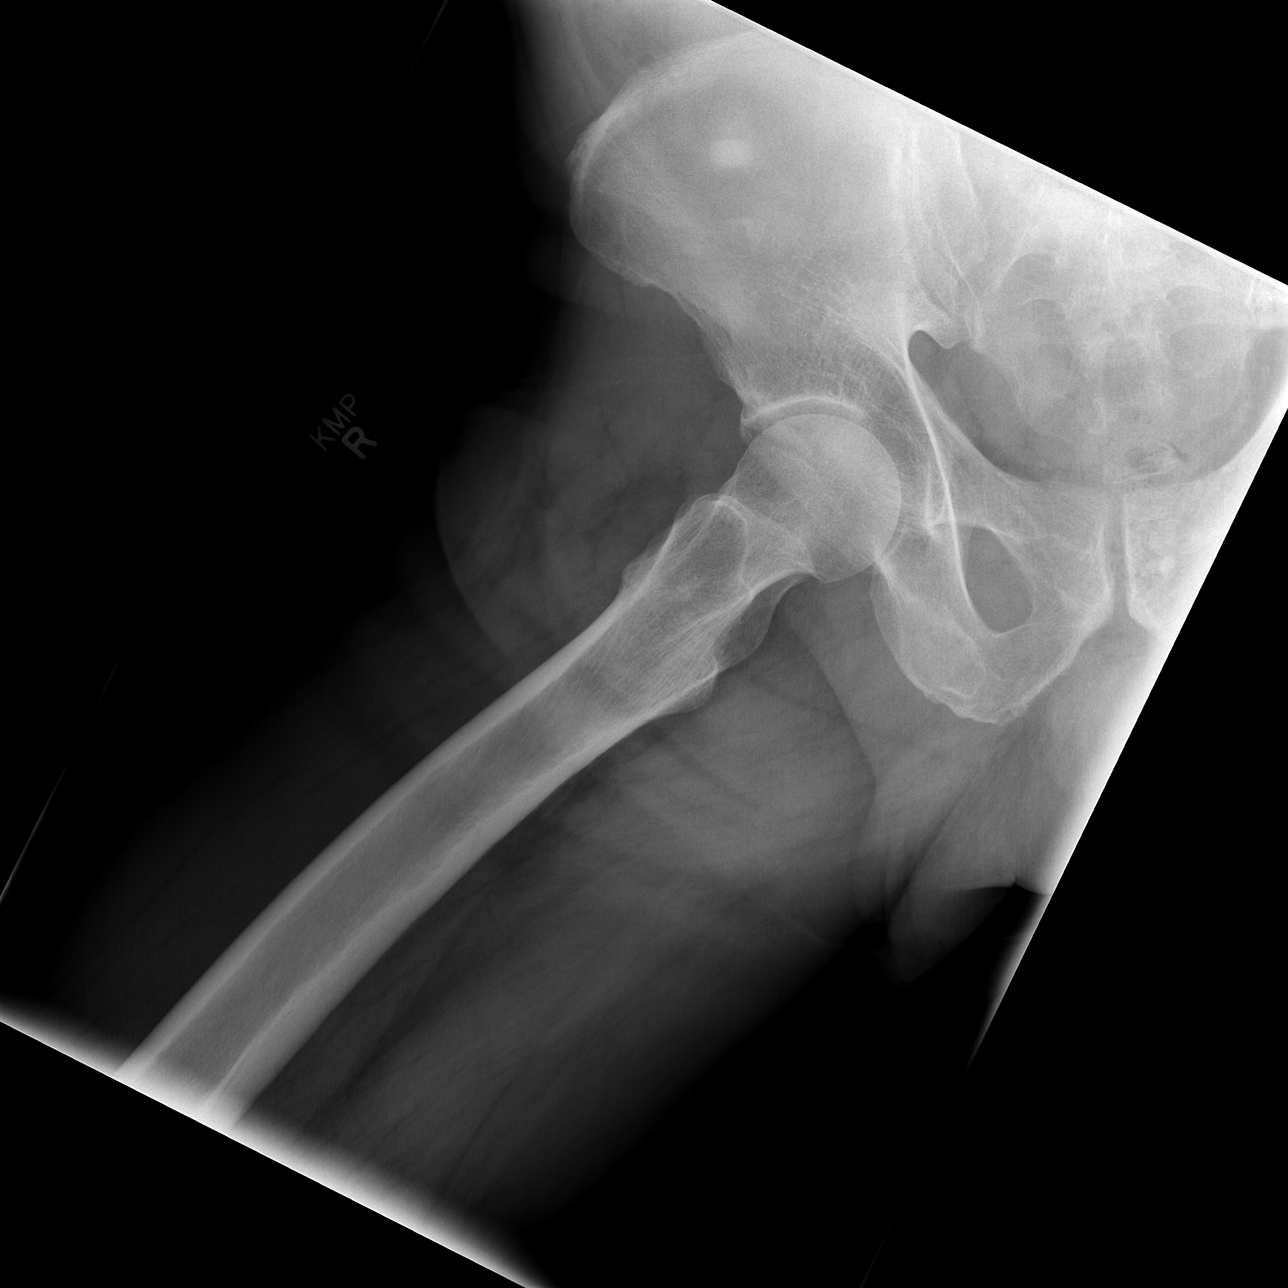

[3 of 3 positions shown; findings below may reference images not displayed]

FINDINGS: No evidence of fracture, malalignment, or focal bone lesion. No
erosive changes, notable spurring, or joint narrowing. No evidence
of osteonecrosis. Presumed in incidental bone island in the
intertrochanteric right femur.
IMPRESSION: Negative pelvis and right hip.  No explanation for hip pain.

## 2016-05-20 ENCOUNTER — Ambulatory Visit: Payer: PRIVATE HEALTH INSURANCE | Admitting: Neurology

## 2016-05-27 ENCOUNTER — Ambulatory Visit: Payer: PRIVATE HEALTH INSURANCE | Admitting: Neurology

## 2016-07-09 ENCOUNTER — Ambulatory Visit: Payer: PRIVATE HEALTH INSURANCE | Admitting: Neurology

## 2016-07-09 DIAGNOSIS — Z029 Encounter for administrative examinations, unspecified: Secondary | ICD-10-CM

## 2016-09-24 ENCOUNTER — Ambulatory Visit: Payer: PRIVATE HEALTH INSURANCE | Admitting: Neurology

## 2017-05-10 ENCOUNTER — Ambulatory Visit (INDEPENDENT_AMBULATORY_CARE_PROVIDER_SITE_OTHER): Payer: Self-pay

## 2017-05-10 ENCOUNTER — Other Ambulatory Visit: Payer: Self-pay | Admitting: Emergency Medicine

## 2017-05-10 DIAGNOSIS — T148XXA Other injury of unspecified body region, initial encounter: Secondary | ICD-10-CM

## 2017-05-10 DIAGNOSIS — S6722XA Crushing injury of left hand, initial encounter: Secondary | ICD-10-CM

## 2017-05-10 DIAGNOSIS — X58XXXA Exposure to other specified factors, initial encounter: Secondary | ICD-10-CM

## 2018-12-27 ENCOUNTER — Emergency Department (INDEPENDENT_AMBULATORY_CARE_PROVIDER_SITE_OTHER): Payer: PRIVATE HEALTH INSURANCE

## 2018-12-27 ENCOUNTER — Emergency Department (INDEPENDENT_AMBULATORY_CARE_PROVIDER_SITE_OTHER)
Admission: EM | Admit: 2018-12-27 | Discharge: 2018-12-27 | Disposition: A | Discharge status: Home / Self Care | Payer: PRIVATE HEALTH INSURANCE | Source: Home / Self Care | Attending: Family Medicine | Admitting: Family Medicine

## 2018-12-27 ENCOUNTER — Other Ambulatory Visit: Payer: Self-pay

## 2018-12-27 DIAGNOSIS — M5412 Radiculopathy, cervical region: Secondary | ICD-10-CM | POA: Diagnosis not present

## 2018-12-27 DIAGNOSIS — M4693 Unspecified inflammatory spondylopathy, cervicothoracic region: Secondary | ICD-10-CM

## 2018-12-27 MED ORDER — PREDNISONE 20 MG PO TABS: ORAL_TABLET | ORAL | 0 refills | Status: DC

## 2018-12-27 NOTE — ED Triage Notes (Signed)
Pt states that has had chest pain for 1 week.  Today pain in the upper left into the jaw, shoulder, and arm.  Hx of high BP took medication this am.

## 2018-12-27 NOTE — ED Provider Notes (Signed)
[facility]    CSN: [id number] Arrival date & time: 12/27/18  0857     History   Chief Complaint Chief Complaint  Patient presents with  . Chest Pain  . Jaw Pain    HPI Lonnie Castillo is a 53 y.o. male.   Patient reports that several months ago he had a brief episode of a tingling sensation in his left shoulder, upper back, and left arm/hand. One week ago while sitting on a couch he experienced recurrent similar ache in his left neck, left shoulder/left upper back, radiating down his left posterior arm to his hand.  He also had a sensation of tightness in his left anterior chest, but no shortness of breath.  No nausea/vomiting, and no abdominal pain.  He has had similar recurrent symptoms every other day, always resolving spontaneously.  He notes that the symptoms are somewhat worse with coughing, although he does not have a cough.  Today he had a brief episode of vague discomfort in his left face, but no headache or change in vision. He states that his BP is normally about 135-145/80-85.  He states that he has been under increased stress recently as a result of COVID-19.  The history is provided by the patient.    Past Medical History:  Diagnosis Date  . Alcohol abuse   . Anxiety   . Hyperlipidemia   . Hypertension   . PSVT (paroxysmal supraventricular tachycardia) (HCC)   . Tobacco abuse     Patient Active Problem List   Diagnosis Date Noted  . Chronic cluster headache, not intractable 01/14/2015  . PSVT (paroxysmal supraventricular tachycardia) (HCC) 02/07/2014  . Hyperlipidemia 02/07/2014  . Tobacco abuse 02/07/2014  . Alcohol abuse 02/07/2014  . HYPERTENSION 07/07/2011  . CELLULITIS AND ABSCESS OF UPPER ARM AND FOREARM 07/07/2011    Past Surgical History:  Procedure Laterality Date  . HAND SURGERY Right   . TOE SURGERY Right        Home Medications    Prior to Admission medications   Medication Sig Start Date End Date Taking? Authorizing  Provider  ALPRAZolam Prudy Feeler) 0.5 MG tablet Take 0.5 mg by mouth at bedtime as needed for anxiety.    [provider]  amLODipine-valsartan (EXFORGE) 10-160 MG per tablet Take 1 tablet by mouth daily.    [provider]  aspirin 81 MG tablet Take 81 mg by mouth daily.    [provider]  B Complex-C (B-COMPLEX WITH VITAMIN C) tablet Take 1 tablet by mouth daily.    [provider]  cholecalciferol (VITAMIN D) 1000 UNITS tablet Take 1,000 Units by mouth daily.    [provider]  cyclobenzaprine (FLEXERIL) 10 MG tablet Take one tab by mouth once or twice daily as needed for muscle spasm 02/19/15   Lattie Haw, MD  Fenofibric Acid 105 MG TABS Take by mouth.    [provider]  flurbiprofen (ANSAID) 50 MG tablet Take 1 tablet (50 mg total) by mouth 2 (two) times daily as needed (severe migraine). 01/14/15   Nita Sickle K, DO  Multiple Vitamin (MULTIVITAMIN) tablet Take 1 tablet by mouth daily.    [provider]  naproxen sodium (ANAPROX) 550 MG tablet Take 550 mg by mouth as needed.    [provider]  nebivolol (BYSTOLIC) 10 MG tablet Take 10 mg by mouth daily.    [provider]  nortriptyline (PAMELOR) 25 MG capsule Take 1 capsule (25 mg total) by mouth at bedtime.  05/26/15   Patel, Roxana Hires K, DO  Omega-3 Fatty Acids (FISH OIL PO) Take 2,400 mg by mouth daily.    [provider]  omeprazole (PRILOSEC) 10 MG capsule Take 10 mg by mouth daily.    [provider]  predniSONE (DELTASONE) 20 MG tablet Take one tab by mouth twice daily for 4 days, then one daily for 3 days. Take with food. 12/27/18   Lattie Haw, MD  Thiamine HCl (VITAMIN B-1) 250 MG tablet Take 250 mg by mouth daily.    [provider]  verapamil (CALAN) 80 MG tablet Take 1 tablet (80 mg total) by mouth 2 (two) times daily. 05/26/15   Nita Sickle K, DO  vitamin B-12 (CYANOCOBALAMIN) 1000 MCG tablet Take 1,000 mcg by mouth  daily.    [provider]  ZOLMitriptan (ZOMIG) 2.5 MG SOLN Place 1 spray into the nose daily. 10/29/14   Glendale Chard, DO    Family History Family History  Problem Relation Age of Onset  . Hyperlipidemia Mother   . Acromegaly Mother   . Arthritis Mother        Osteoarthritis  . Pulmonary embolism Father   . Healthy Brother     Social History Social History   Tobacco Use  . Smoking status: Former Smoker    Packs/day: 1.50    Years: 15.00    Pack years: 22.50    Types: Cigarettes    Last attempt to quit: 02/08/2000    Years since quitting: 18.8  Substance Use Topics  . Alcohol use: Yes    Alcohol/week: 0.0 standard drinks    Comment: 18 back of beer a week  . Drug use: No     Allergies   Hctz [hydrochlorothiazide]; Lisinopril; and Paxil [paroxetine hcl]   Review of Systems Review of Systems  Constitutional: Negative for activity change, chills, diaphoresis, fatigue and fever.  HENT: Negative.   Eyes: Negative.   Respiratory: Positive for chest tightness. Negative for cough, shortness of breath, wheezing and stridor.   Cardiovascular: Negative for chest pain, palpitations and leg swelling.  Gastrointestinal: Negative.   Genitourinary: Negative.   Musculoskeletal: Positive for neck pain.       Left shoulder ache radiating to left upper back and into left arm.  Skin: Negative for rash.  Neurological: Negative for dizziness, tremors, seizures, syncope, facial asymmetry, speech difficulty, weakness, light-headedness, numbness and headaches.     Physical Exam Triage Vital Signs ED Triage Vitals  Enc Vitals Group     BP      Pulse      Resp      Temp      Temp src      SpO2      Weight      Height      Head Circumference      Peak Flow      Pain Score      Pain Loc      Pain Edu?      Excl. in GC?    No data found.  Updated Vital Signs BP (!) 182/106 (BP Location: Left Arm)   Pulse (!) 109   Temp 97.8 F (36.6 C) (Oral)   Resp 20   Ht   (1.727 m)   Wt 85.7 kg   SpO2 100%   BMI 28.74 kg/m   Visual Acuity Right Eye Distance:   Left Eye Distance:   Bilateral Distance:    Right Eye Near:   Left  Eye Near:    Bilateral Near:     Physical Exam Vitals signs and nursing note reviewed.  Constitutional:      General: He is not in acute distress.    Appearance: Normal appearance.  HENT:     Head: Normocephalic.     Right Ear: External ear normal.     Nose: Nose normal.     Mouth/Throat:     Pharynx: Oropharynx is clear.  Eyes:     Extraocular Movements: Extraocular movements intact.     Conjunctiva/sclera: Conjunctivae normal.     Pupils: Pupils are equal, round, and reactive to light.  Neck:     Musculoskeletal: No muscular tenderness.     Vascular: No carotid bruit.     Comments: Patient's left neck, shoulder, and arm symptoms are recreated by flexing his neck to the left, rotating his head to the left, and extending his neck. Cardiovascular:     Rate and Rhythm: Tachycardia present.     Heart sounds: Normal heart sounds.  Pulmonary:     Breath sounds: Normal breath sounds.  Abdominal:     Tenderness: There is no abdominal tenderness.  Musculoskeletal:     Left shoulder: He exhibits normal range of motion.     Right lower leg: No edema.     Left lower leg: No edema.  Lymphadenopathy:     Cervical: No cervical adenopathy.  Skin:    General: Skin is warm and dry.     Findings: No rash.          Comments: Patient experiences pain in areas outlined on diagram, but he has no tenderness to palpation in these areas.  Neurological:     General: No focal deficit present.     Mental Status: He is alert and oriented to person, place, and time.     Cranial Nerves: Facial asymmetry present. No cranial nerve deficit or dysarthria.     Sensory: Sensation is intact.     Motor: Motor function is intact.     Coordination: Coordination is intact.     Gait: Gait is intact.     Deep Tendon Reflexes: Reflexes are  normal and symmetric.     Reflex Scores:      Bicep reflexes are 2+ on the right side.      Patellar reflexes are 2+ on the right side.      Achilles reflexes are 2+ on the right side.     UC Treatments / Results  Labs (all labs ordered are listed, but only abnormal results are displayed) Labs Reviewed - No data to display  EKG  Rate:  100 BPM PR:  182 msec QT:  334 msec QTcH:  430 msec QRSD:  86 msec QRS axis:  37 degrees Interpretation:  No acute changes; normal sinus rhythm  EKG shows no significant change from EKG done 02/08/14.   Radiology Dg Cervical Spine Complete  Result Date: 12/27/2018 CLINICAL DATA:  Recurring left-sided neck pain extending into the left shoulder and left arm. EXAM: CERVICAL SPINE - COMPLETE 4+ VIEW COMPARISON:  None. FINDINGS: There is no evidence of cervical spine fracture or prevertebral soft tissue swelling. Alignment is normal. No foraminal stenosis. 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 Disc spaces are normal. 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 No other significant abnormalities. Electronically Signed   By: Francene Boyers M.D.   On: 12/27/2018 10:12    Procedures Procedures (including critical care time)  Medications Ordered in UC Medications -  No data to display  Initial Impression / Assessment and Plan / UC Course  I have reviewed the triage vital signs and the nursing notes.  Pertinent labs & imaging results that were available during my care of the patient were reviewed by me and considered in my medical decision making (see chart for details).    Begin prednisone burst/taper. Followup with Dr. Clementeen GrahamEvan [facility] ([facility] for further evaluation.  Final Clinical Impressions(s) / UC Diagnoses   Final diagnoses:  Cervical radiculopathy     Discharge Instructions     Apply ice pack to back of neck for 20 to 30 minutes, 3 to 4 times daily  Continue until pain decreases.  May take Tylenol if needed for  pain.  If symptoms become significantly worse during the night or over the weekend, proceed to the local emergency room.     ED Prescriptions    Medication Sig Dispense Auth. Provider   predniSONE (DELTASONE) 20 MG tablet Take one tab by mouth twice daily for 4 days, then one daily for 3 days. Take with food. 11 tablet Lattie HawBeese,  A, MD         Lattie HawBeese,  A, MD 12/27/18 1440

## 2018-12-27 NOTE — Discharge Instructions (Addendum)
Apply ice pack to back of neck for 20 to 30 minutes, 3 to 4 times daily  Continue until pain decreases.  May take Tylenol if needed for pain.  If symptoms become significantly worse during the night or over the weekend, proceed to the local emergency room.

## 2019-01-04 ENCOUNTER — Encounter: Payer: Self-pay | Admitting: Family Medicine

## 2019-01-04 ENCOUNTER — Ambulatory Visit (INDEPENDENT_AMBULATORY_CARE_PROVIDER_SITE_OTHER): Payer: PRIVATE HEALTH INSURANCE | Admitting: Family Medicine

## 2019-01-04 VITALS — BP 141/85 | HR 74 | Temp 98.1°F | Wt 193.0 lb

## 2019-01-04 DIAGNOSIS — G5622 Lesion of ulnar nerve, left upper limb: Secondary | ICD-10-CM

## 2019-01-04 DIAGNOSIS — M5412 Radiculopathy, cervical region: Secondary | ICD-10-CM | POA: Insufficient documentation

## 2019-01-04 MED ORDER — GABAPENTIN 300 MG PO CAPS: ORAL_CAPSULE | ORAL | 3 refills | Status: AC

## 2019-01-04 NOTE — Progress Notes (Signed)
Subjective:    CC: Left arm pain and left hand pain  HPI: Patient developed left lateral neck pain radiating to left arm starting a few weeks ago.  He was seen in urgent care when it worsened and he was thought to have cervical radiculopathy.  X-rays obtained which did show a left C7-T1 facet hypertrophy but otherwise were unremarkable.  He was given a short course of prednisone which helped significantly to improve his pain but was only temporary.  He notes the pain is slowly returning.  He denies any weakness.  Additionally he notes a chronic issue of pain in his left elbow radiating to the left ulnar hand including fifth digit and ulnar side of fourth digit.  He thinks this is cubital tunnel syndrome and notes that is been ongoing for years.  He is tried conservative management including bracing and padding which have not helped.  He notes this issue is slightly worse as well.  He denies any weakness distally.  He notes prednisone course prescribed for his cervical spine did help his ulnar nerve issue temporarily as well.      Past medical history, Surgical history, Family history not pertinant except as noted below, Social history, Allergies, and medications have been entered into the medical record, reviewed, and no changes needed.   Review of Systems: No headache, visual changes, nausea, vomiting, diarrhea, constipation, dizziness, abdominal pain, skin rash, fevers, chills, night sweats, weight loss, swollen lymph nodes, body aches, joint swelling, muscle aches, chest pain, shortness of breath, mood changes, visual or auditory hallucinations.   Objective:    Vitals:   01/04/19 0933  BP: (!) 141/85  Pulse: 74  Temp: 98.1 F (36.7 C)   General: Well Developed, well nourished, and in no acute distress.  Neuro/Psych: Alert and oriented x3, extra-ocular muscles intact, able to move all 4 extremities, sensation grossly intact. Skin: Warm and dry, no rashes noted.  Respiratory:  Not using accessory muscles, speaking in full sentences, trachea midline.  Cardiovascular: Pulses palpable, no extremity edema. Abdomen: Does not appear distended. MSK:  C-spine: Nontender to spinal midline.  Normal cervical motion.  Left-sided Spurling's test positive.  Upper extremity motion strength sensation and reflexes are equal normal throughout.  Left elbow normal-appearing normal motion.  Positive Tinel's at cubital tunnel. Pain is reproducible radiating down left arm with persistent elbow flexion positioning.  Of note patient notes that he was ill in late February and suspects that he may have had COVID-19.  He is not asymptomatic and interested in antibody testing if possible.  Lab and Radiology Results Dg Cervical Spine Complete  Result Date: 12/27/2018 CLINICAL DATA:  Recurring left-sided neck pain extending into the left shoulder and left arm. EXAM: CERVICAL SPINE - COMPLETE 4+ VIEW COMPARISON:  None. FINDINGS: There is no evidence of cervical spine fracture or prevertebral soft tissue swelling. Alignment is normal. No foraminal stenosis. 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 Disc spaces are normal. 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 No other significant abnormalities. Electronically Signed   By: Francene BoyersJames  Maxwell M.D.   On: 12/27/2018 10:12   I personally (independently) visualized and performed the interpretation of the images attached in this note.  Procedure: Real-time Ultrasound Guided  left cubital tunnel ulnar nerve hydrodissection Device: GE Logiq E   Images permanently stored and available for review in the ultrasound unit. Verbal informed consent obtained.  Discussed risks and benefits of procedure. Warned about infection bleeding damage to structures skin hypopigmentation and fat  atrophy among others. Patient expresses understanding and agreement Time-out conducted.   Noted no overlying erythema, induration, or other signs of local infection.   Skin  prepped in a sterile fashion.   Local anesthesia: Topical Ethyl chloride.   With sterile technique and under real time ultrasound guidance:  40 mg of Kenalog and 2 mL of lidocaine injected easily.   Completed without difficulty   Pain immediately resolved suggesting accurate placement of the medication.   Advised to call if fevers/chills, erythema, induration, drainage, or persistent bleeding.   Images permanently stored and available for review in the ultrasound unit.  Impression: Technically successful ultrasound guided injection.   Limited musculoskeletal ultrasound of left cubital tunnel in preparation for injection shows under dynamic motion subluxation of ulnar nerve reproducing his distal paresthesias.   Impression and Recommendations:    Assessment and Plan: 53 y.o. male with left cervical radiculopathy likely at C8 nerve root based on x-ray and distribution of pain down his left arm.  Plan for trial of gabapentin.  If not improving would be reasonable to consider proceed with MRI for cervical epidural steroid injection planning.  Additionally discussed home exercise program as well.  Patient also has bothersome left cubital tunnel syndrome.  He already has had trials of conservative management with little benefit.  He has dynamic subluxation of ulnar nerve seen on ultrasound imaging.  Based on this information proceeded with ultrasound-guided ulnar nerve hydrodissection at cubital tunnel.  We will proceed with watchful waiting and bulky splint to prevent elbow flexion especially at bedtime.  Gabapentin also prescribed which should be helpful.  If not improving next that would be nerve conduction study.  Question about COVID-19 serology testing.  Patient did have significant illness in February based on his history.  There is a [facility] IgG antibody test now available for COVID-19 virus.  I provided him with information for the [facility] lab test and asked him to contact his insurance  company to make sure this test would be covered before it is ordered.  I am happy to order it or his primary care provider can order it which would make more sense. [facility] test code [id number].  CC: Laurann Montana, MD   Meds ordered this encounter  Medications  . gabapentin (NEURONTIN) 300 MG capsule    Sig: One tab PO qHS for a week, then BID for a week, then TID. May double weekly to a max of 3,600mg /day    Dispense:  180 capsule    Refill:  3    Discussed warning signs or symptoms. Please see discharge instructions. Patient expresses understanding.

## 2019-01-04 NOTE — Patient Instructions (Addendum)
Thank you for coming in today. Call or go to the ER if you develop a large red swollen joint with extreme pain or oozing puss.   Use a bulky splint or brace at bedtime to keep the elbow from bending a lot.  This will help cubital tunnel syndrome.  Also you can use gabapentin for pinched nerve in the neck and in the elbow.   If the cubital tunnel issue is not getting better next step is a EMG/nerve conduction study.  If the neck issue doe not improve next step is MRI and injection.   PT is also an options.    Cubital Tunnel Syndrome  Cubital tunnel syndrome is a condition that causes pain and weakness of the forearm and hand. It happens when one of the nerves that runs along the inside of the elbow joint (ulnar nerve) becomes irritated. This condition is usually caused by repeated arm motions that are done during sports or work-related activities. What are the causes? This condition may be caused by:  Increased pressure on the ulnar nerve at the elbow, arm, or forearm. This can result from: ? Irritation caused by repeated elbow bending. ? Poorly healed elbow fractures. ? Tumors in the elbow. These are usually noncancerous (benign). ? Scar tissue that develops in the elbow after an injury. ? Bony growths (spurs) near the ulnar nerve.  Stretching of the nerve due to loose elbow ligaments.  Trauma to the nerve at the elbow. What increases the risk? The following factors may make you more likely to develop this condition:  Doing manual labor that requires frequent bending of the elbow.  Playing sports that include repeated or strenuous throwing motions, such as baseball.  Playing contact sports, such as football or lacrosse.  Not warming up properly before activities.  Having diabetes.  Having an underactive thyroid (hypothyroidism). What are the signs or symptoms? Symptoms of this condition include:  Clumsiness or weakness of the hand.  Tenderness of the inner  elbow.  Aching or soreness of the inner elbow, forearm, or fingers, especially the little finger or the ring finger.  Increased pain when forcing the elbow to bend.  Reduced control when throwing objects.  Tingling, numbness, or a burning feeling inside the forearm or in part of the hand or fingers, especially the little finger or the ring finger.  Sharp pains that shoot from the elbow down to the wrist and hand.  The inability to grip or pinch hard. How is this diagnosed? This condition is diagnosed based on:  Your symptoms and medical history. Your health care provider will also ask for details about any injury.  A physical exam. You may also have tests, including:  Electromyogram (EMG). This test measures electrical signals sent by your nerves into the muscles.  Nerve conduction study. This test measures how well electrical signals pass through your nerves.  Imaging tests, such as X-rays, ultrasound, and MRI. These tests check for possible causes of your condition. How is this treated? This condition may be treated by:  Stopping the activities that are causing your symptoms to get worse.  Icing and taking medicines to reduce pain and swelling.  Wearing a splint to prevent your elbow from bending, or wearing an elbow pad where the ulnar nerve is closest to the skin.  Working with a physical therapist in less severe cases. This may help to: ? Decrease your symptoms. ? Improve the strength and range of motion of your elbow, forearm, and hand. If these  treatments do not help, surgery may be needed. Follow these instructions at home: If you have a splint:  Wear the splint as told by your health care provider. Remove it only as told by your health care provider.  Loosen the splint if your fingers tingle, become numb, or turn cold and blue.  Keep the splint clean.  If the splint is not waterproof: ? Do not let it get wet. ? Cover it with a watertight covering when you  take a bath or shower. Managing pain, stiffness, and swelling   If directed, put ice on the injured area: ? Put ice in a plastic bag. ? Place a towel between your skin and the bag. ? Leave the ice on for 20 minutes, 2-3 times a day.  Move your fingers often to avoid stiffness and to lessen swelling.  Raise (elevate) the injured area above the level of your heart while you are sitting or lying down. General instructions  Take over-the-counter and prescription medicines only as told by your health care provider.  Do any exercise or physical therapy as told by your health care provider.  Do not drive or use heavy machinery while taking prescription pain medicine.  If you were given an elbow pad, wear it as told by your health care provider.  Keep all follow-up visits as told by your health care provider. This is important. Contact a health care provider if:  Your symptoms get worse.  Your symptoms do not get better with treatment.  You have new pain.  Your hand on the injured side feels numb or cold. Summary  Cubital tunnel syndrome is a condition that causes pain and weakness of the forearm and hand.  You are more likely to develop this condition if you do work or play sports that involve repeated arm movements.  This condition is often treated by stopping repetitive activities, applying ice, and using anti-inflammatory medicines.  In rare cases, surgery may be needed. This information is not intended to replace advice given to you by your health care provider. Make sure you discuss any questions you have with your health care provider. Document Released: 08/30/2005 Document Revised: 01/16/2018 Document Reviewed: 01/16/2018 Elsevier Interactive Patient Education  2019 Elsevier Inc.   Cervical Radiculopathy  Cervical radiculopathy happens when a nerve in the neck (cervical nerve) is pinched or bruised. This condition can develop because of an injury or as part of the  normal aging process. Pressure on the cervical nerves can cause pain or numbness that runs from the neck all the way down into the arm and fingers. Usually, this condition gets better with rest. Treatment may be needed if the condition does not improve. What are the causes? This condition may be caused by:  Injury.  Slipped (herniated) disk.  Muscle tightness in the neck because of overuse.  Arthritis.  Breakdown or degeneration in the bones and joints of the spine (spondylosis) due to aging.  Bone spurs that may develop near the cervical nerves. What are the signs or symptoms? Symptoms of this condition include:  Pain that runs from the neck to the arm and hand. The pain can be severe or irritating. It may be worse when the neck is moved.  Numbness or weakness in the affected arm and hand. How is this diagnosed? This condition may be diagnosed based on symptoms, medical history, and a physical exam. You may also have tests, including:  X-rays.  CT scan.  MRI.  Electromyogram (EMG).  Nerve conduction  tests. How is this treated? In many cases, treatment is not needed for this condition. With rest, the condition usually gets better over time. If treatment is needed, options may include:  Wearing a soft neck collar for short periods of time.  Physical therapy to strengthen your neck muscles.  Medicines, such as NSAIDs, oral corticosteroids, or spinal injections.  Surgery. This may be needed if other treatments do not help. Various types of surgery may be done depending on the cause of your problems. Follow these instructions at home: Managing pain  Take over-the-counter and prescription medicines only as told by your health care provider.  If directed, apply ice to the affected area. ? Put ice in a plastic bag. ? Place a towel between your skin and the bag. ? Leave the ice on for 20 minutes, 2-3 times per day.  If ice does not help, you can try using heat. Take a  warm shower or warm bath, or use a heat pack as told by your health care provider.  Try a gentle neck and shoulder massage to help relieve symptoms. Activity  Rest as needed. Follow instructions from your health care provider about any restrictions on activities.  Do stretching and strengthening exercises as told by your health care provider or physical therapist. General instructions  If you were given a soft collar, wear it as told by your health care provider.  Use a flat pillow when you sleep.  Keep all follow-up visits as told by your health care provider. This is important. Contact a health care provider if:  Your condition does not improve with treatment. Get help right away if:  Your pain gets much worse and cannot be controlled with medicines.  You have weakness or numbness in your hand, arm, face, or leg.  You have a high fever.  You have a stiff, rigid neck.  You lose control of your bowels or your bladder (have incontinence).  You have trouble with walking, balance, or speaking. This information is not intended to replace advice given to you by your health care provider. Make sure you discuss any questions you have with your health care provider. Document Released: 05/25/2001 Document Revised: 02/05/2016 Document Reviewed: 10/24/2014 Elsevier Interactive Patient Education  [employer]2019 Else[employer] Inc.

## 2019-02-01 ENCOUNTER — Emergency Department (INDEPENDENT_AMBULATORY_CARE_PROVIDER_SITE_OTHER)
Admission: EM | Admit: 2019-02-01 | Discharge: 2019-02-01 | Disposition: A | Discharge status: Home / Self Care | Payer: PRIVATE HEALTH INSURANCE | Source: Home / Self Care

## 2019-02-01 ENCOUNTER — Other Ambulatory Visit: Payer: Self-pay

## 2019-02-01 ENCOUNTER — Emergency Department (INDEPENDENT_AMBULATORY_CARE_PROVIDER_SITE_OTHER): Payer: PRIVATE HEALTH INSURANCE

## 2019-02-01 ENCOUNTER — Encounter: Payer: Self-pay | Admitting: Emergency Medicine

## 2019-02-01 DIAGNOSIS — R1031 Right lower quadrant pain: Secondary | ICD-10-CM

## 2019-02-01 DIAGNOSIS — R110 Nausea: Secondary | ICD-10-CM

## 2019-02-01 DIAGNOSIS — R52 Pain, unspecified: Secondary | ICD-10-CM | POA: Diagnosis not present

## 2019-02-01 DIAGNOSIS — R109 Unspecified abdominal pain: Secondary | ICD-10-CM

## 2019-02-01 DIAGNOSIS — R197 Diarrhea, unspecified: Secondary | ICD-10-CM

## 2019-02-01 LAB — POCT CBC W AUTO DIFF (K'VILLE URGENT CARE)

## 2019-02-01 LAB — POCT URINALYSIS DIP (MANUAL ENTRY)
Bilirubin, UA: NEGATIVE
Blood, UA: NEGATIVE
Glucose, UA: NEGATIVE mg/dL
Ketones, POC UA: NEGATIVE mg/dL
Leukocytes, UA: NEGATIVE
Nitrite, UA: NEGATIVE
Protein Ur, POC: NEGATIVE mg/dL
Spec Grav, UA: <=1.005 — AB (ref 1.010–1.025)
Urobilinogen, UA: 0.2 E.U./dL
pH, UA: 5.5 (ref 5.0–8.0)

## 2019-02-01 MED ORDER — ONDANSETRON 4 MG PO TBDP: ORAL_TABLET | ORAL | 0 refills | Status: AC

## 2019-02-01 NOTE — ED Provider Notes (Signed)
[facility]    CSN: [id number] Arrival date & time: 02/01/19  0910     History   Chief Complaint Chief Complaint  Patient presents with   Flank Pain    HPI Lonnie Castillo is a 53 y.o. male.   HPI Lonnie Castillo is a 53 y.o. male presenting to UC with c/o Right side abdominal pain that was waxing and waning for about 3 days but suddenly worsened yesterday with associated nausea, 3 episodes of diarrhea, body aches, chills and fatigue. No blood or mucous in the stool.  He had to leave work early yesterday due to feeling bad. He did not try to eat or drink anything yesterday due to felling bad but he had a breakfast sandwich this morning, which made his pain worse. Denies known sick contacts or recent travel. Denies cough, congestion or chest tightness. No HA or dizziness. No prior hx of abdominal surgeries.    Past Medical History:  Diagnosis Date   Alcohol abuse    Anxiety    Hyperlipidemia    Hypertension    PSVT (paroxysmal supraventricular tachycardia) (HCC)    Tobacco abuse     Patient Active Problem List   Diagnosis Date Noted   Cubital tunnel syndrome on left 01/04/2019   Cervical radiculopathy at C8 01/04/2019   Chronic cluster headache, not intractable 01/14/2015   PSVT (paroxysmal supraventricular tachycardia) (HCC) 02/07/2014   Hyperlipidemia 02/07/2014   Tobacco abuse 02/07/2014   Alcohol abuse 02/07/2014   HYPERTENSION 07/07/2011   CELLULITIS AND ABSCESS OF UPPER ARM AND FOREARM 07/07/2011    Past Surgical History:  Procedure Laterality Date   HAND SURGERY Right    TOE SURGERY Right        Home Medications    Prior to Admission medications   Medication Sig Start Date End Date Taking? Authorizing Provider  amLODipine-valsartan (EXFORGE) 10-160 MG per tablet Take 1 tablet by mouth daily.    [provider]  aspirin 81 MG tablet Take 81 mg by mouth daily.    [provider]  B Complex-C (B-COMPLEX WITH  VITAMIN C) tablet Take 1 tablet by mouth daily.    [provider]  cholecalciferol (VITAMIN D) 1000 UNITS tablet Take 1,000 Units by mouth daily.    [provider]  cloNIDine (CATAPRES) 0.1 MG tablet TAKE 1 TABLET BY MOUTH TWICE DAILY FOR 90 DAYS 12/09/18   [provider]  Fenofibric Acid 105 MG TABS Take by mouth.    [provider]  flurbiprofen (ANSAID) 50 MG tablet Take 1 tablet (50 mg total) by mouth 2 (two) times daily as needed (severe migraine). 01/14/15   Nita SicklePatel, Donika K, DO  gabapentin (NEURONTIN) 300 MG capsule One tab PO qHS for a week, then BID for a week, then TID. May double weekly to a max of 3,600mg /day 01/04/19   Rodolph Bongorey, Evan S, MD  Multiple Vitamin (MULTIVITAMIN) tablet Take 1 tablet by mouth daily.    [provider]  naproxen sodium (ANAPROX) 550 MG tablet Take 550 mg by mouth as needed.    [provider]  nebivolol (BYSTOLIC) 10 MG tablet Take 10 mg by mouth daily.    [provider]  nortriptyline (PAMELOR) 25 MG capsule Take 1 capsule (25 mg total) by mouth at bedtime. 05/26/15   Patel, Roxana Hiresonika K, DO  Omega-3 Fatty Acids (FISH OIL PO) Take 2,400 mg by mouth daily.    [provider]  omeprazole (PRILOSEC) 10 MG capsule Take 10 mg  by mouth daily.    [provider]  ondansetron (ZOFRAN ODT) 4 MG disintegrating tablet 4mg  ODT q4 hours prn nausea/vomit 02/01/19   Lurene Shadow, PA-C  Thiamine HCl (VITAMIN B-1) 250 MG tablet Take 250 mg by mouth daily.    [provider]  vitamin B-12 (CYANOCOBALAMIN) 1000 MCG tablet Take 1,000 mcg by mouth daily.    [provider]    Family History Family History  Problem Relation Age of Onset   Hyperlipidemia Mother    Acromegaly Mother    Arthritis Mother        Osteoarthritis   Pulmonary embolism Father    Healthy Brother     Social History Social History   Tobacco Use   Smoking status: Former Smoker    Packs/day: 1.50     Years: 15.00    Pack years: 22.50    Types: Cigarettes    Last attempt to quit: 02/08/2000    Years since quitting: 18.9   Smokeless tobacco: Never Used  Substance Use Topics   Alcohol use: Yes    Alcohol/week: 0.0 standard drinks    Comment: 18 back of beer a week   Drug use: No     Allergies   Hctz [hydrochlorothiazide]; Lisinopril; and Paxil [paroxetine hcl]   Review of Systems Review of Systems  Constitutional: Positive for appetite change, chills and fatigue. Negative for fever.  HENT: Negative for congestion, ear pain, sore throat, trouble swallowing and voice change.   Respiratory: Negative for cough and shortness of breath.   Cardiovascular: Negative for chest pain and palpitations.  Gastrointestinal: Positive for abdominal pain, diarrhea and nausea. Negative for blood in stool and vomiting.  Genitourinary: Positive for flank pain (Right). Negative for dysuria, frequency and hematuria.  Musculoskeletal: Negative for arthralgias, back pain and myalgias.  Skin: Negative for rash.  Neurological: Negative for dizziness and headaches.     Physical Exam Triage Vital Signs ED Triage Vitals  Enc Vitals Group     BP      Pulse      Resp      Temp      Temp src      SpO2      Weight      Height      Head Circumference      Peak Flow      Pain Score      Pain Loc      Pain Edu?      Excl. in GC?    No data found.  Updated Vital Signs BP 131/82 (BP Location: Right Arm)    Pulse 76    Temp 97.7 F (36.5 C) (Oral)    Ht 5\' 8"  (1.727 m)    Wt 185 lb (83.9 kg)    SpO2 98%    BMI 28.13 kg/m   Visual Acuity Right Eye Distance:   Left Eye Distance:   Bilateral Distance:    Right Eye Near:   Left Eye Near:    Bilateral Near:     Physical Exam Vitals signs and nursing note reviewed.  Constitutional:      Appearance: Normal appearance. He is well-developed.  HENT:     Head: Normocephalic and atraumatic.     Right Ear: Tympanic membrane normal.     Left  Ear: Tympanic membrane normal.     Nose: Nose normal.     Mouth/Throat:     Lips: Pink.     Mouth: Mucous membranes are moist.  Pharynx: Oropharynx is clear. Uvula midline.  Neck:     Musculoskeletal: Normal range of motion.  Cardiovascular:     Rate and Rhythm: Normal rate and regular rhythm.  Pulmonary:     Effort: Pulmonary effort is normal.     Breath sounds: Normal breath sounds.  Abdominal:     Palpations: Abdomen is soft.     Tenderness: There is abdominal tenderness in the right lower quadrant. There is no right CVA tenderness or left CVA tenderness. Positive signs include McBurney's sign.    Musculoskeletal: Normal range of motion.  Skin:    General: Skin is warm and dry.  Neurological:     Mental Status: He is alert and oriented to person, place, and time.  Psychiatric:        Behavior: Behavior normal.      UC Treatments / Results  Labs (all labs ordered are listed, but only abnormal results are displayed) Labs Reviewed  COMPLETE METABOLIC PANEL WITH GFR  POCT CBC W AUTO DIFF ([facility])  POCT URINALYSIS DIP (MANUAL ENTRY)    EKG None  Radiology Ct Abdomen Pelvis Wo Contrast  Result Date: 02/01/2019 CLINICAL DATA:  Right lower quadrant pain and nausea.  Vomiting. EXAM: CT ABDOMEN AND PELVIS WITHOUT CONTRAST TECHNIQUE: Multidetector CT imaging of the abdomen and pelvis was performed following the standard protocol without IV contrast. Oral contrast was administered. COMPARISON:  None. FINDINGS: Lower chest: Lung bases are clear. Hepatobiliary: There is a degree of hepatic steatosis. No focal liver lesions are evident on this noncontrast enhanced study. The gallbladder wall is not appreciably thickened. There is no biliary duct dilatation. Pancreas: There is no pancreatic mass or inflammatory focus. Spleen: No splenic lesions are evident. Adrenals/Urinary Tract: Adrenals bilaterally appear normal. There is no demonstrable renal mass on either side.  There is no evident hydronephrosis on either side. There is no intrarenal or ureteral calculus on either side. Urinary bladder is midline with wall thickness within normal limits. Stomach/Bowel: There is no appreciable bowel wall or mesenteric thickening. Colon is largely decompressed. There is no evident bowel obstruction. The terminal ileum appears normal. No free air or portal venous air evident. Vascular/Lymphatic: There are scattered foci of aortic and right common iliac artery atherosclerosis. No aneurysm evident. There is no adenopathy in the abdomen or pelvis. Reproductive: There are a few tiny prostatic calculi. Prostate and seminal vesicles are normal in size and contour. There is no evident pelvic mass. Other: The appendix appears normal. There is no abscess or ascites in the abdomen or pelvis. There is mild fat in each inguinal ring. Musculoskeletal: There are no blastic or lytic bone lesions. There is no intramuscular or abdominal wall lesion evident. The IMPRESSION: 1. A cause for patient's symptoms has not been established with this study. 2. Appendix appears normal. No bowel wall thickening or bowel obstruction. No abscess in the abdomen or pelvis. 3. No demonstrable renal or ureteral calculus. No hydronephrosis. Urinary bladder wall thickness normal. Tiny prostatic calculi are noted. 4.  There are foci of aortic atherosclerosis. 5.  Hepatic steatosis. Electronically Signed   By: Bretta Bang III M.D.   On: 02/01/2019 11:13    Procedures Procedures (including critical care time)  Medications Ordered in UC Medications - No data to display  Initial Impression / Assessment and Plan / UC Course  I have reviewed the triage vital signs and the nursing notes.  Pertinent labs & imaging results that were available during my care of the  patient were reviewed by me and considered in my medical decision making (see chart for details).     Pt c/o loss of appetite, chills, diarrhea, and RLQ  abdominal pain. Tenderness at [facility], CT abdomen performed to r/o appendicitis. Reviewed imaging with pt, no acute abnormalities noted on imaging CBC and UA: WNL CMP: pending.  Will tx as a mild gastroenteritis Home care info provided. Discussed symptoms that warrant emergent care in the ED.  Final Clinical Impressions(s) / UC Diagnoses   Final diagnoses:  Nausea without vomiting  Diarrhea, unspecified type  Body aches  RLQ abdominal pain  Flank pain  Right flank pain     Discharge Instructions      Be sure to get a lot of rest and stay well hydrated with sports drinks, water, diluted juices, and clear sodas.  Avoid fried fatty food, spicy food, and milk as these foods can cause worsening stomach upset.   Please call to schedule a follow up exam with your primary care provider if symptoms not improving in 3-4 days.  Call 911 or go to the hospital if you develop worsening symptoms including worsening abdominal pain, blood in your stool, chest tightness, shortness of breath, dizziness/passing out, or other new concerning symptoms develop.    ED Prescriptions    Medication Sig Dispense Auth. Provider   ondansetron (ZOFRAN ODT) 4 MG disintegrating tablet  ODT q4 hours prn nausea/vomit 12 tablet Lurene Shadow, PA-C     Controlled Substance Prescriptions [place] Controlled Substance Registry consulted? Not Applicable   Rolla Plate 02/01/19 1156

## 2019-02-01 NOTE — Discharge Instructions (Signed)
°  Be sure to get a lot of rest and stay well hydrated with sports drinks, water, diluted juices, and clear sodas.  Avoid fried fatty food, spicy food, and milk as these foods can cause worsening stomach upset.   Please call to schedule a follow up exam with your primary care provider if symptoms not improving in 3-4 days.  Call 911 or go to the hospital if you develop worsening symptoms including worsening abdominal pain, blood in your stool, chest tightness, shortness of breath, dizziness/passing out, or other new concerning symptoms develop.

## 2019-02-01 NOTE — ED Triage Notes (Signed)
RT flank burning radiates down started yesterday. Also feels like something is in my throat.

## 2019-02-02 ENCOUNTER — Telehealth: Payer: Self-pay

## 2019-02-02 LAB — COMPLETE METABOLIC PANEL WITH GFR
AG Ratio: 1.6 (calc) (ref 1.0–2.5)
ALT: 41 U/L (ref 9–46)
AST: 31 U/L (ref 10–35)
Albumin: 4.6 g/dL (ref 3.6–5.1)
Alkaline phosphatase (APISO): 91 U/L (ref 35–144)
BUN: 13 mg/dL (ref 7–25)
CO2: 24 mmol/L (ref 20–32)
Calcium: 9.9 mg/dL (ref 8.6–10.3)
Chloride: 97 mmol/L — ABNORMAL LOW (ref 98–110)
Creat: 0.89 mg/dL (ref 0.70–1.33)
GFR, Est African American: 113 mL/min/{1.73_m2} (ref >=60)
GFR, Est Non African American: 98 mL/min/{1.73_m2} (ref >=60)
Globulin: 2.8 g/dL (calc) (ref 1.9–3.7)
Glucose, Bld: 150 mg/dL — ABNORMAL HIGH (ref 65–99)
Potassium: 4.2 mmol/L (ref 3.5–5.3)
Sodium: 134 mmol/L — ABNORMAL LOW (ref 135–146)
Total Bilirubin: 0.6 mg/dL (ref 0.2–1.2)
Total Protein: 7.4 g/dL (ref 6.1–8.1)

## 2019-02-02 NOTE — Telephone Encounter (Signed)
Called patient, and he is feeling better.  Gave lab results, and recommended to follow up with PCP regarding glucose.

## 2019-06-21 IMAGING — CT CT ABDOMEN AND PELVIS WITHOUT CONTRAST
2 of 4 series · 16 of 46 positions shown, 18 images · non-contrast
Comparison: None.

CLINICAL DATA: Right lower quadrant pain and nausea.  Vomiting.

EXAM:
CT ABDOMEN AND PELVIS WITHOUT CONTRAST
TECHNIQUE: Multidetector CT imaging of the abdomen and pelvis was performed
following the standard protocol without IV contrast. Oral contrast
was administered.

[Series 2: axial st · axial · 0.83mm/px · z∈[-552,-22]mm · 13 of 116 slices shown, 15 images]
[im 5/116  soft-tissue]
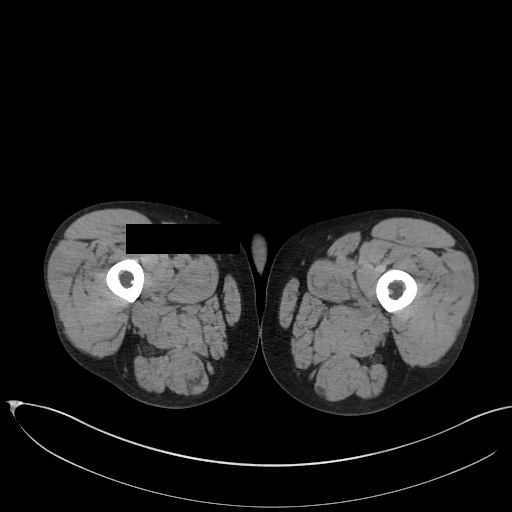
[im 5/116  bone]
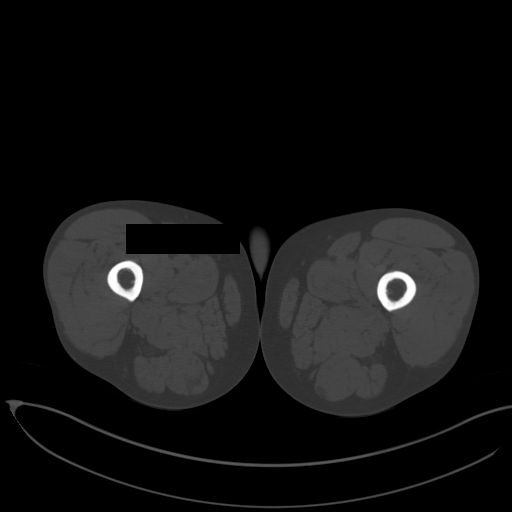
[im 14/116  soft-tissue]
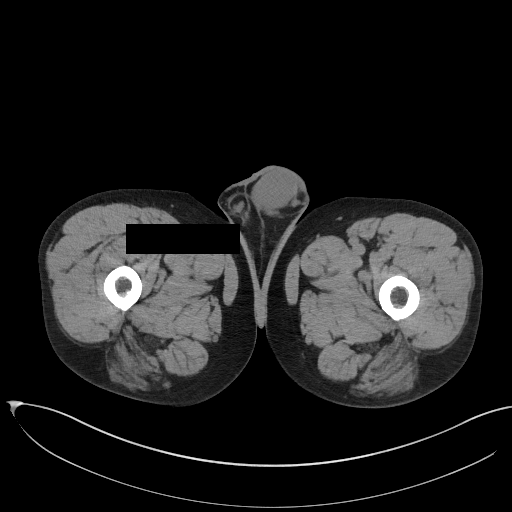
[im 23/116  soft-tissue]
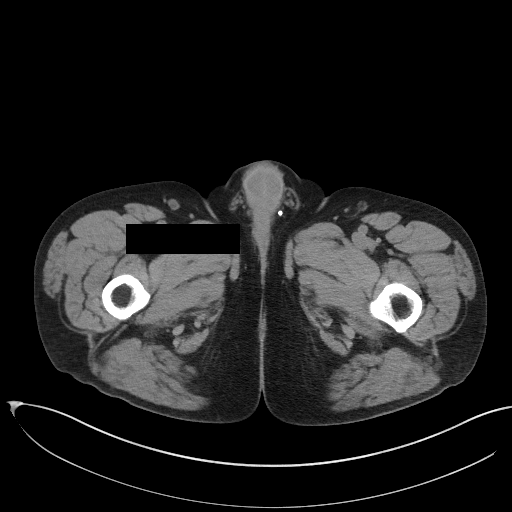
[im 31/116  soft-tissue]
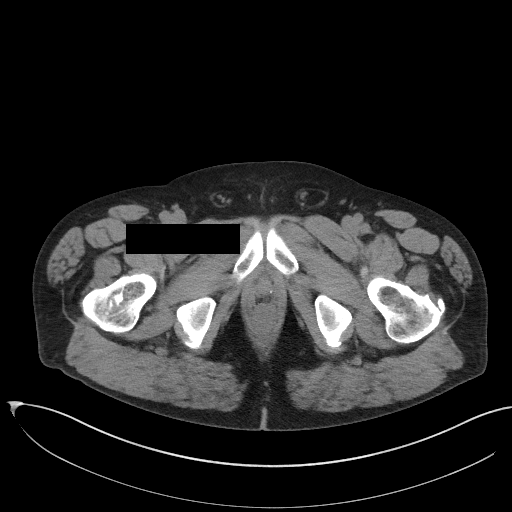
[im 40/116  soft-tissue]
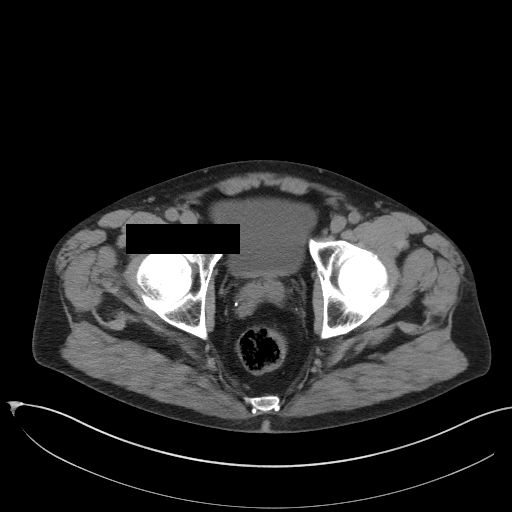
[im 49/116  soft-tissue]
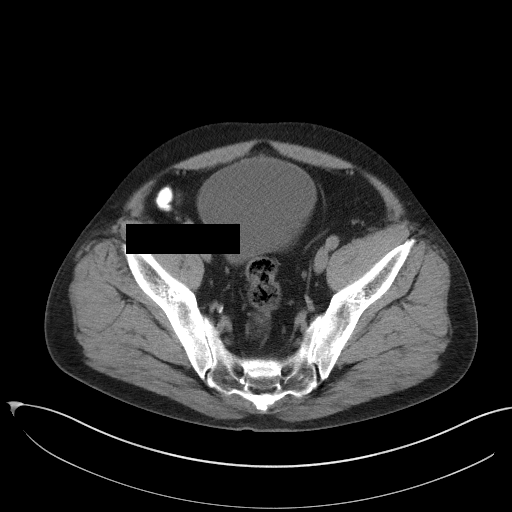
[im 58/116  soft-tissue]
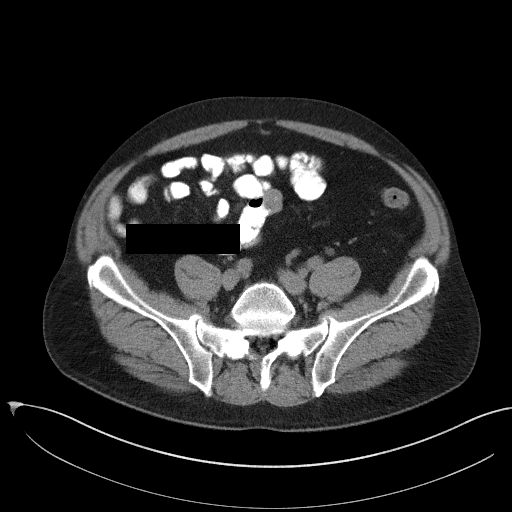
[im 67/116  soft-tissue]
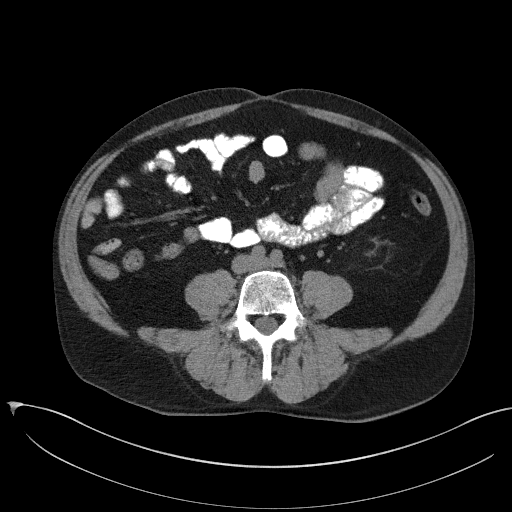
[im 76/116  soft-tissue]
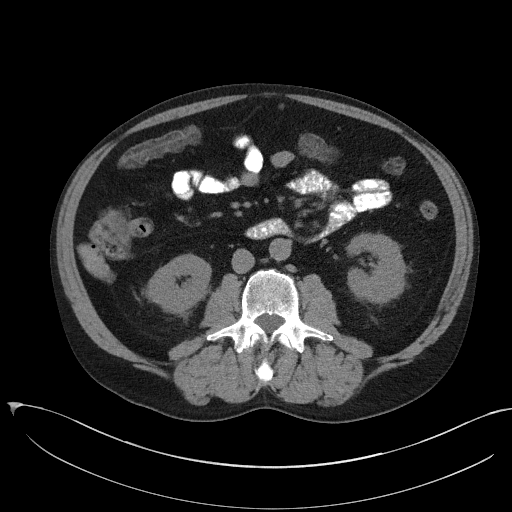
[im 76/116  bone]
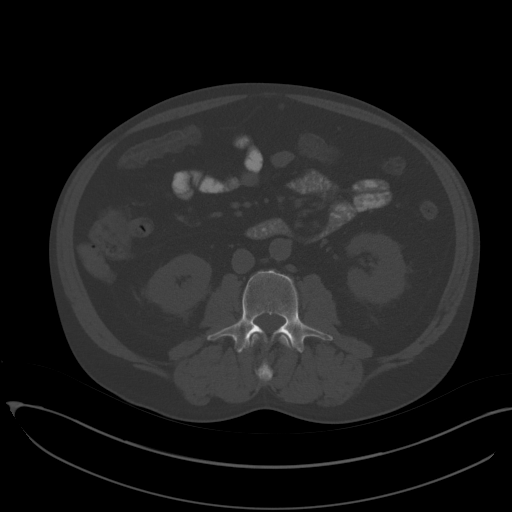
[im 85/116  soft-tissue]
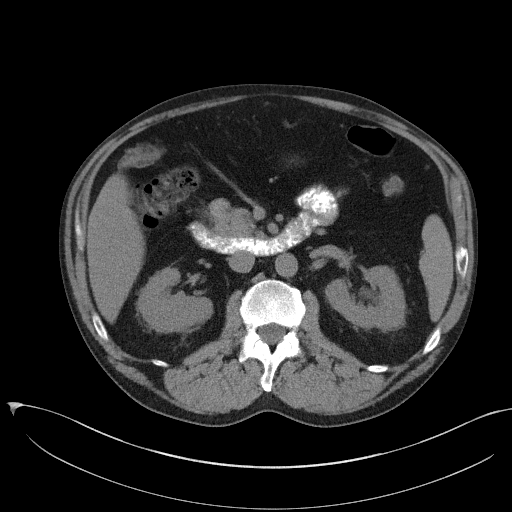
[im 93/116  soft-tissue]
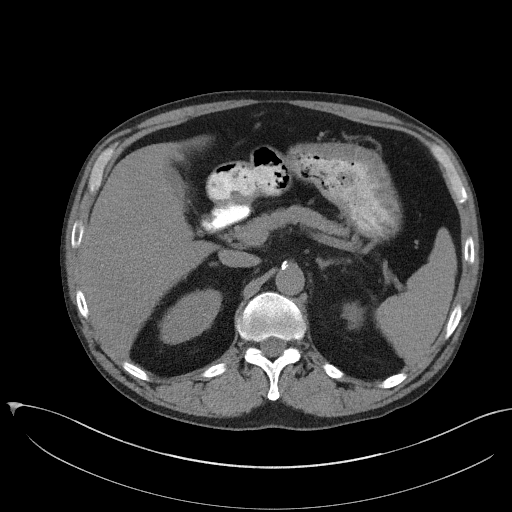
[im 102/116  soft-tissue]
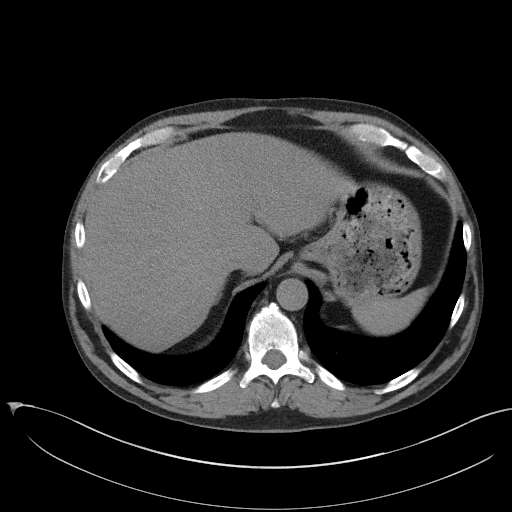
[im 111/116  soft-tissue]
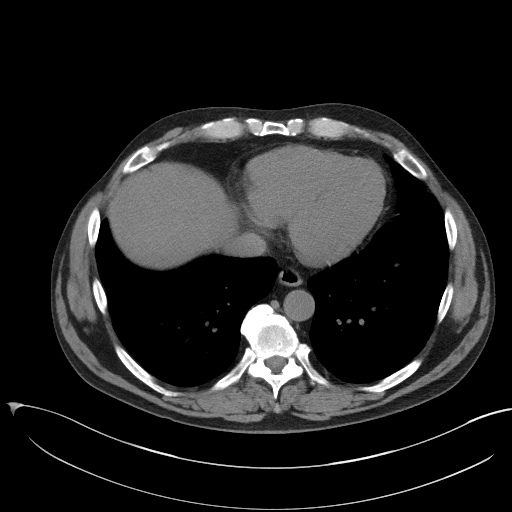

[Series 5: coronal st · coronal · 0.97mm/px · 3 of 104 slices shown]
[im 35/104  soft-tissue]
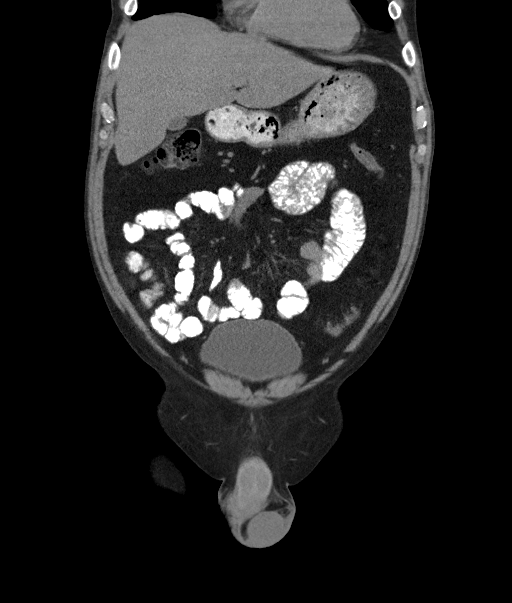
[im 46/104  soft-tissue]
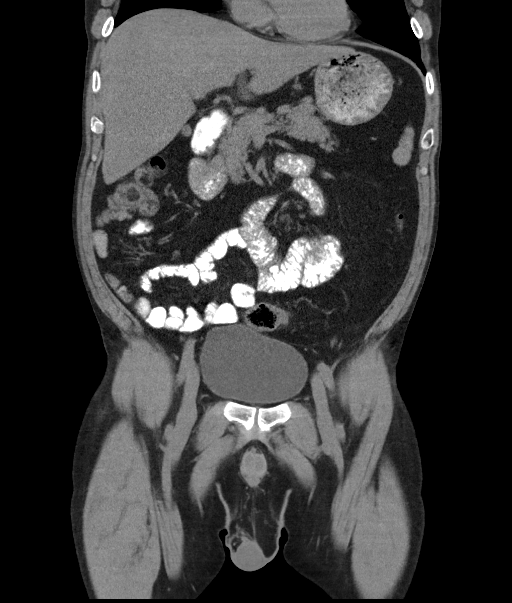
[im 58/104  soft-tissue]
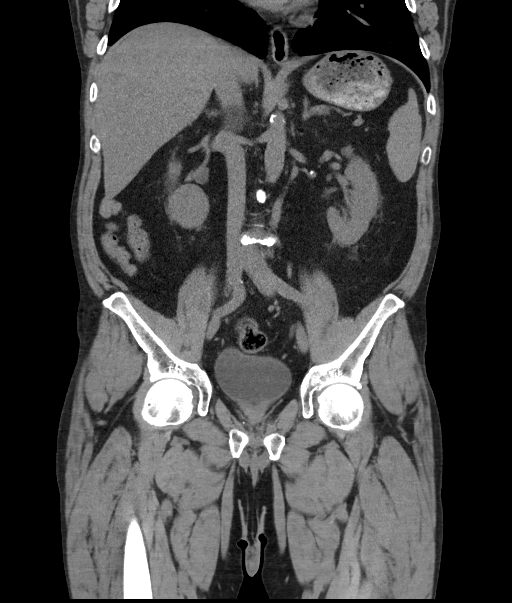

[16 of 46 positions shown; findings below may reference images not displayed]

FINDINGS: Lower chest: Lung bases are clear.

Hepatobiliary: There is a degree of hepatic steatosis. No focal
liver lesions are evident on this noncontrast enhanced study. The
gallbladder wall is not appreciably thickened. There is no biliary
duct dilatation.

Pancreas: There is no pancreatic mass or inflammatory focus.

Spleen: No splenic lesions are evident.

Adrenals/Urinary Tract: Adrenals bilaterally appear normal. There is
no demonstrable renal mass on either side. There is no evident
hydronephrosis on either side. There is no intrarenal or ureteral
calculus on either side. Urinary bladder is midline with wall
thickness within normal limits.

Stomach/Bowel: There is no appreciable bowel wall or mesenteric
thickening. Colon is largely decompressed. There is no evident bowel
obstruction. The terminal ileum appears normal. No free air or
portal venous air evident.

Vascular/Lymphatic: There are scattered foci of aortic and right
common iliac artery atherosclerosis. No aneurysm evident. There is
no adenopathy in the abdomen or pelvis.

Reproductive: There are a few tiny prostatic calculi. Prostate and
seminal vesicles are normal in size and contour. There is no evident
pelvic mass.

Other: The appendix appears normal. There is no abscess or ascites
in the abdomen or pelvis. There is mild fat in each inguinal ring.

Musculoskeletal: There are no blastic or lytic bone lesions. There
is no intramuscular or abdominal wall lesion evident. The
IMPRESSION: 1. A cause for patient's symptoms has not been established with this
study.

2. Appendix appears normal. No bowel wall thickening or bowel
obstruction. No abscess in the abdomen or pelvis.

3. No demonstrable renal or ureteral calculus. No hydronephrosis.
Urinary bladder wall thickness normal. Tiny prostatic calculi are
noted.

4.  There are foci of aortic atherosclerosis.

5.  Hepatic steatosis.

## 2022-05-25 ENCOUNTER — Encounter (HOSPITAL_BASED_OUTPATIENT_CLINIC_OR_DEPARTMENT_OTHER): Payer: Self-pay

## 2022-05-25 DIAGNOSIS — R5383 Other fatigue: Secondary | ICD-10-CM

## 2022-05-25 DIAGNOSIS — R0683 Snoring: Secondary | ICD-10-CM
